# Patient Record
Sex: Female | Born: 1965
Health system: Southern US, Community
[De-identification: ages and names within clinical notes are randomized; demographics above are authoritative.]

## PROBLEM LIST (undated history)

## (undated) DIAGNOSIS — Z803 Family history of malignant neoplasm of breast: Secondary | ICD-10-CM

## (undated) DIAGNOSIS — E119 Type 2 diabetes mellitus without complications: Secondary | ICD-10-CM

## (undated) DIAGNOSIS — I10 Essential (primary) hypertension: Secondary | ICD-10-CM

## (undated) DIAGNOSIS — R51 Headache: Secondary | ICD-10-CM

## (undated) DIAGNOSIS — R519 Headache, unspecified: Secondary | ICD-10-CM

## (undated) DIAGNOSIS — Z8041 Family history of malignant neoplasm of ovary: Secondary | ICD-10-CM

## (undated) DIAGNOSIS — H539 Unspecified visual disturbance: Secondary | ICD-10-CM

## (undated) DIAGNOSIS — E049 Nontoxic goiter, unspecified: Secondary | ICD-10-CM

## (undated) DIAGNOSIS — D649 Anemia, unspecified: Secondary | ICD-10-CM

## (undated) DIAGNOSIS — R0602 Shortness of breath: Secondary | ICD-10-CM

## (undated) HISTORY — DX: Headache, unspecified: R51.9

## (undated) HISTORY — PX: CHOLECYSTECTOMY: SHX55

## (undated) HISTORY — DX: Headache: R51

## (undated) HISTORY — DX: Family history of malignant neoplasm of breast: Z80.3

## (undated) HISTORY — DX: Family history of malignant neoplasm of ovary: Z80.41

## (undated) HISTORY — DX: Type 2 diabetes mellitus without complications: E11.9

## (undated) HISTORY — PX: TUBAL LIGATION: SHX77

## (undated) HISTORY — DX: Unspecified visual disturbance: H53.9

## (undated) HISTORY — PX: BREAST REDUCTION SURGERY: SHX8

---

## 1997-06-23 ENCOUNTER — Inpatient Hospital Stay (HOSPITAL_COMMUNITY): Admission: AD | Admit: 1997-06-23 | Discharge: 1997-06-23 | Payer: Self-pay | Admitting: Obstetrics and Gynecology

## 1997-06-24 ENCOUNTER — Inpatient Hospital Stay (HOSPITAL_COMMUNITY): Admission: AD | Admit: 1997-06-24 | Discharge: 1997-06-24 | Payer: Self-pay | Admitting: Obstetrics and Gynecology

## 1997-06-27 ENCOUNTER — Encounter (HOSPITAL_COMMUNITY): Admission: RE | Admit: 1997-06-27 | Discharge: 1997-07-05 | Payer: Self-pay | Admitting: Obstetrics and Gynecology

## 1997-07-05 ENCOUNTER — Inpatient Hospital Stay (HOSPITAL_COMMUNITY): Admission: AD | Admit: 1997-07-05 | Discharge: 1997-07-08 | Payer: Self-pay | Admitting: Obstetrics and Gynecology

## 1999-02-09 ENCOUNTER — Other Ambulatory Visit: Admission: RE | Admit: 1999-02-09 | Discharge: 1999-02-09 | Payer: Self-pay | Admitting: Family Medicine

## 2001-03-07 HISTORY — PX: THYROID LOBECTOMY: SHX420

## 2001-03-16 ENCOUNTER — Ambulatory Visit (HOSPITAL_COMMUNITY): Admission: RE | Admit: 2001-03-16 | Discharge: 2001-03-17 | Payer: Self-pay | Admitting: General Surgery

## 2001-03-16 ENCOUNTER — Encounter (INDEPENDENT_AMBULATORY_CARE_PROVIDER_SITE_OTHER): Payer: Self-pay | Admitting: Specialist

## 2001-03-25 ENCOUNTER — Other Ambulatory Visit: Admission: RE | Admit: 2001-03-25 | Discharge: 2001-03-25 | Payer: Self-pay | Admitting: Family Medicine

## 2002-08-11 ENCOUNTER — Encounter: Admission: RE | Admit: 2002-08-11 | Discharge: 2002-08-11 | Payer: Self-pay | Admitting: Family Medicine

## 2002-08-11 ENCOUNTER — Encounter: Payer: Self-pay | Admitting: Family Medicine

## 2002-11-04 ENCOUNTER — Emergency Department (HOSPITAL_COMMUNITY): Admission: EM | Admit: 2002-11-04 | Discharge: 2002-11-04 | Payer: Self-pay | Admitting: Emergency Medicine

## 2002-11-04 ENCOUNTER — Encounter: Payer: Self-pay | Admitting: Emergency Medicine

## 2003-08-02 ENCOUNTER — Other Ambulatory Visit: Admission: RE | Admit: 2003-08-02 | Discharge: 2003-08-02 | Payer: Self-pay | Admitting: Family Medicine

## 2004-10-01 ENCOUNTER — Other Ambulatory Visit: Admission: RE | Admit: 2004-10-01 | Discharge: 2004-10-01 | Payer: Self-pay | Admitting: Family Medicine

## 2004-10-02 ENCOUNTER — Encounter: Admission: RE | Admit: 2004-10-02 | Discharge: 2004-10-02 | Payer: Self-pay | Admitting: Family Medicine

## 2004-10-30 ENCOUNTER — Encounter: Admission: RE | Admit: 2004-10-30 | Discharge: 2004-10-30 | Payer: Self-pay | Admitting: Family Medicine

## 2005-02-04 HISTORY — PX: REDUCTION MAMMAPLASTY: SUR839

## 2005-02-13 ENCOUNTER — Emergency Department (HOSPITAL_COMMUNITY): Admission: EM | Admit: 2005-02-13 | Discharge: 2005-02-13 | Payer: Self-pay | Admitting: Family Medicine

## 2005-09-04 HISTORY — PX: OTHER SURGICAL HISTORY: SHX169

## 2005-09-16 ENCOUNTER — Ambulatory Visit (HOSPITAL_BASED_OUTPATIENT_CLINIC_OR_DEPARTMENT_OTHER): Admission: RE | Admit: 2005-09-16 | Discharge: 2005-09-17 | Payer: Self-pay | Admitting: Plastic Surgery

## 2005-09-16 ENCOUNTER — Encounter (INDEPENDENT_AMBULATORY_CARE_PROVIDER_SITE_OTHER): Payer: Self-pay | Admitting: *Deleted

## 2005-11-09 ENCOUNTER — Emergency Department (HOSPITAL_COMMUNITY): Admission: EM | Admit: 2005-11-09 | Discharge: 2005-11-09 | Payer: Self-pay | Admitting: Emergency Medicine

## 2006-06-04 ENCOUNTER — Encounter: Admission: RE | Admit: 2006-06-04 | Discharge: 2006-06-04 | Payer: Self-pay | Admitting: Family Medicine

## 2006-07-04 ENCOUNTER — Other Ambulatory Visit: Admission: RE | Admit: 2006-07-04 | Discharge: 2006-07-04 | Payer: Self-pay | Admitting: Family Medicine

## 2006-12-04 ENCOUNTER — Encounter: Admission: RE | Admit: 2006-12-04 | Discharge: 2006-12-04 | Payer: Self-pay | Admitting: Family Medicine

## 2007-06-12 ENCOUNTER — Encounter: Admission: RE | Admit: 2007-06-12 | Discharge: 2007-06-12 | Payer: Self-pay | Admitting: Family Medicine

## 2007-07-29 ENCOUNTER — Other Ambulatory Visit: Admission: RE | Admit: 2007-07-29 | Discharge: 2007-07-29 | Payer: Self-pay | Admitting: Family Medicine

## 2008-06-13 ENCOUNTER — Encounter: Admission: RE | Admit: 2008-06-13 | Discharge: 2008-06-13 | Payer: Self-pay | Admitting: Family Medicine

## 2008-06-15 ENCOUNTER — Encounter: Admission: RE | Admit: 2008-06-15 | Discharge: 2008-06-15 | Payer: Self-pay | Admitting: Family Medicine

## 2008-11-14 ENCOUNTER — Emergency Department (HOSPITAL_COMMUNITY): Admission: EM | Admit: 2008-11-14 | Discharge: 2008-11-15 | Payer: Self-pay | Admitting: Emergency Medicine

## 2008-12-28 ENCOUNTER — Other Ambulatory Visit: Admission: RE | Admit: 2008-12-28 | Discharge: 2008-12-28 | Payer: Self-pay | Admitting: Family Medicine

## 2009-03-25 ENCOUNTER — Ambulatory Visit: Payer: Self-pay | Admitting: Diagnostic Radiology

## 2009-03-25 ENCOUNTER — Emergency Department (HOSPITAL_BASED_OUTPATIENT_CLINIC_OR_DEPARTMENT_OTHER): Admission: EM | Admit: 2009-03-25 | Discharge: 2009-03-26 | Payer: Self-pay | Admitting: Emergency Medicine

## 2009-11-03 ENCOUNTER — Encounter: Admission: RE | Admit: 2009-11-03 | Discharge: 2009-11-03 | Payer: Self-pay | Admitting: Diagnostic Neuroimaging

## 2010-01-01 ENCOUNTER — Other Ambulatory Visit: Admission: RE | Admit: 2010-01-01 | Discharge: 2010-01-01 | Payer: Self-pay | Admitting: Family Medicine

## 2010-01-15 ENCOUNTER — Encounter
Admission: RE | Admit: 2010-01-15 | Discharge: 2010-01-15 | Payer: Self-pay | Source: Home / Self Care | Attending: Family Medicine | Admitting: Family Medicine

## 2010-01-30 ENCOUNTER — Encounter
Admission: RE | Admit: 2010-01-30 | Discharge: 2010-01-30 | Payer: Self-pay | Source: Home / Self Care | Attending: Family Medicine | Admitting: Family Medicine

## 2010-02-25 ENCOUNTER — Encounter: Payer: Self-pay | Admitting: Family Medicine

## 2010-04-27 LAB — BASIC METABOLIC PANEL
BUN: 13 mg/dL (ref 6–23)
CO2: 32 mEq/L (ref 19–32)
Calcium: 9.2 mg/dL (ref 8.4–10.5)
Chloride: 102 mEq/L (ref 96–112)
Creatinine, Ser: 0.9 mg/dL (ref 0.4–1.2)
GFR calc Af Amer: >60 mL/min (ref >60)
GFR calc non Af Amer: >60 mL/min (ref >60)
Glucose, Bld: 105 mg/dL — ABNORMAL HIGH (ref 70–99)
Potassium: 2.9 mEq/L — ABNORMAL LOW (ref 3.5–5.1)
Sodium: 144 mEq/L (ref 135–145)

## 2010-04-27 LAB — PROTEIN, CSF: Total  Protein, CSF: 21 mg/dL (ref 15–45)

## 2010-04-27 LAB — CBC
HCT: 28.9 % — ABNORMAL LOW (ref 36.0–46.0)
Hemoglobin: 9.1 g/dL — ABNORMAL LOW (ref 12.0–15.0)
MCHC: 31.4 g/dL (ref 30.0–36.0)
MCV: 67 fL — ABNORMAL LOW (ref 78.0–100.0)
Platelets: 294 10*3/uL (ref 150–400)
RBC: 4.31 MIL/uL (ref 3.87–5.11)
RDW: 17.3 % — ABNORMAL HIGH (ref 11.5–15.5)
WBC: 8.1 10*3/uL (ref 4.0–10.5)

## 2010-04-27 LAB — DIFFERENTIAL
Basophils Absolute: 0.1 10*3/uL (ref 0.0–0.1)
Basophils Relative: 1 % (ref 0–1)
Eosinophils Absolute: 0.2 10*3/uL (ref 0.0–0.7)
Eosinophils Relative: 2 % (ref 0–5)
Lymphocytes Relative: 25 % (ref 12–46)
Lymphs Abs: 2 10*3/uL (ref 0.7–4.0)
Monocytes Absolute: 0.6 10*3/uL (ref 0.1–1.0)
Monocytes Relative: 7 % (ref 3–12)
Neutro Abs: 5.2 10*3/uL (ref 1.7–7.7)
Neutrophils Relative %: 65 % (ref 43–77)

## 2010-04-27 LAB — GRAM STAIN: Gram Stain: NONE SEEN

## 2010-04-27 LAB — CSF CELL COUNT WITH DIFFERENTIAL
RBC Count, CSF: 1 /mm3 — ABNORMAL HIGH
RBC Count, CSF: 7 /mm3 — ABNORMAL HIGH
Tube #: 1
Tube #: 4
WBC, CSF: 2 /mm3 (ref 0–5)
WBC, CSF: 2 /mm3 (ref 0–5)

## 2010-04-27 LAB — CSF CULTURE W GRAM STAIN
Culture: NO GROWTH
Gram Stain: NONE SEEN

## 2010-04-27 LAB — GLUCOSE, CSF: Glucose, CSF: 58 mg/dL (ref 43–76)

## 2010-05-11 LAB — URINALYSIS, ROUTINE W REFLEX MICROSCOPIC
Bilirubin Urine: NEGATIVE
Glucose, UA: NEGATIVE mg/dL
Hgb urine dipstick: NEGATIVE
Ketones, ur: NEGATIVE mg/dL
Nitrite: NEGATIVE
Protein, ur: NEGATIVE mg/dL
Specific Gravity, Urine: 1.009 (ref 1.005–1.030)
Urobilinogen, UA: 0.2 mg/dL (ref 0.0–1.0)
pH: 6 (ref 5.0–8.0)

## 2010-05-11 LAB — BASIC METABOLIC PANEL
BUN: 8 mg/dL (ref 6–23)
CO2: 28 mEq/L (ref 19–32)
Calcium: 8.8 mg/dL (ref 8.4–10.5)
Chloride: 104 mEq/L (ref 96–112)
Creatinine, Ser: 0.79 mg/dL (ref 0.4–1.2)
GFR calc Af Amer: >60 mL/min (ref >60)
GFR calc non Af Amer: >60 mL/min (ref >60)
Glucose, Bld: 123 mg/dL — ABNORMAL HIGH (ref 70–99)
Potassium: 3.3 mEq/L — ABNORMAL LOW (ref 3.5–5.1)
Sodium: 138 mEq/L (ref 135–145)

## 2010-05-11 LAB — POCT CARDIAC MARKERS
CKMB, poc: <1 ng/mL — ABNORMAL LOW (ref 1.0–8.0)
Myoglobin, poc: 93.5 ng/mL (ref 12–200)
Troponin i, poc: <0.05 ng/mL (ref 0.00–0.09)

## 2010-05-11 LAB — CBC
HCT: 27.9 % — ABNORMAL LOW (ref 36.0–46.0)
Hemoglobin: 8.6 g/dL — ABNORMAL LOW (ref 12.0–15.0)
MCHC: 31 g/dL (ref 30.0–36.0)
MCV: 66.5 fL — ABNORMAL LOW (ref 78.0–100.0)
Platelets: 303 10*3/uL (ref 150–400)
RBC: 4.19 MIL/uL (ref 3.87–5.11)
RDW: 18.8 % — ABNORMAL HIGH (ref 11.5–15.5)
WBC: 9.9 10*3/uL (ref 4.0–10.5)

## 2010-05-11 LAB — DIFFERENTIAL
Basophils Absolute: 0 10*3/uL (ref 0.0–0.1)
Basophils Relative: 0 % (ref 0–1)
Eosinophils Absolute: 0.1 10*3/uL (ref 0.0–0.7)
Eosinophils Relative: 1 % (ref 0–5)
Lymphocytes Relative: 19 % (ref 12–46)
Lymphs Abs: 1.9 10*3/uL (ref 0.7–4.0)
Monocytes Absolute: 0.7 10*3/uL (ref 0.1–1.0)
Monocytes Relative: 7 % (ref 3–12)
Neutro Abs: 7.2 10*3/uL (ref 1.7–7.7)
Neutrophils Relative %: 73 % (ref 43–77)

## 2010-05-11 LAB — HEMOCCULT GUIAC POC 1CARD (OFFICE): Fecal Occult Bld: NEGATIVE

## 2010-05-11 LAB — D-DIMER, QUANTITATIVE: D-Dimer, Quant: 0.43 ug/mL-FEU (ref 0.00–0.48)

## 2010-06-22 NOTE — Op Note (Signed)
Crystal Mercado, Crystal Mercado               ACCOUNT NO.:  [id number]   MEDICAL RECORD NO.:  [id number]          PATIENT TYPE:  AMB   LOCATION:  DSC                          FACILITY:  [facility]   PHYSICIAN:  Brantley Persons, M.D.DATE OF BIRTH:  10/22/1965   DATE OF PROCEDURE:  09/17/2005  DATE OF DISCHARGE:  09/17/2005                                 OPERATIVE REPORT   PREOPERATIVE DIAGNOSIS:  Bilateral macromastia.   POSTOPERATIVE DIAGNOSIS:  Bilateral macromastia.   PROCEDURE:  Bilateral reduction mammoplasties.   ATTENDING SURGEON:  Brantley Persons, M.D.   ANESTHESIA:  General endotracheal.   ANESTHESIOLOGIST:  Dr. Gelene Mink.   ESTIMATED BLOOD LOSS:  150 mL.   FLUID REPLACEMENT:  3200 mL crystalloid.   URINE OUTPUT:  800 mL.   COMPLICATIONS:  None.   INDICATIONS FOR PROCEDURE:  The patient is a 45 year old African American  female with bilateral macromastia that is clinically symptomatic.  She  presents to undergo bilateral reduction mammoplasties.   JUSTIFICATION FOR OVERNIGHT STAY:  Progressive pain control along with  ambulation and monitoring of the nipples and breast flaps.   PROCEDURE:  The patient was marked in preop holding area in the pattern of  wise for the future bilateral reduction mammoplasties.  She was then taken  back to the OR, placed on the table in supine position.  After adequate  general endotracheal anesthesia was obtained, the patient's chest was  prepped with Betadine and alcohol and draped in sterile fashion.  The base  of the breasts were then injected 1% lidocaine with epinephrine.  After  adequate hemostasis anesthesia had taken effect, the procedure was begun.   Both breast reductions were performed in the breasts in the following  manner.  Using the 45 mm nipple marker, the nipples were marked.  Then the  skin incised and de-epithelialized around the nipple-areolar complex down to  the inframammary crease in the inferior pedicle pattern.  Next  the medial,  superior and lateral skin flaps were elevated down to the chest wall.  The  excess fat and glandular tissue were then removed from the inferior pedicle.  The nipple-areolar complex was then examined and found to be pink and  viable.  The wound was irrigated with saline irrigation.  Meticulous  hemostasis was obtained with the Bovie electrocautery.  The inferior  pedicles were centralized using a 3-0 Prolene suture.  A #10 JP flat fully  fluted drain was then placed into the wound.  The skin flaps brought  together at the inverted T junction with a 2-0 Prolene suture.  The  incisions were stapled for temporary closure.  The breasts were then  compared and found to have good shape and symmetry.  The staples were then  removed and the incisions were closed using a 3-0 Monocryl in the dermal  layer followed by 4-0 Monocryl running intracuticular stitch on the skin.  The JP drains were sewn in place using 3-0 nylon suture.   The patient was then placed in the upright position.  The future location of  the nipple-areolar complexes was marked on each breast mound using  the 42 mm  nipple marker.  The patient was then placed back into the recumbent  position.   The nipple-areolar complexes were then brought out onto the breast mounds on  both breasts in the following manner.  Using the previously marked the  nipple-areolar complex locations on the breast mounds.  This was incised and  the skin and soft tissue was excised full thickness.  The nipple-areolar  complex was then brought out through this aperture and sewn in place using 4-  0 Monocryl in the dermal layer followed by a 5-0 Monocryl running  intracuticular stitch on the skin.  The incisions were then dressed with  Benzoin and Steri-Strips and the nipples additionally with bacitracin  ointment and Adaptic.  4x4s were then placed over the incisions and ABD  pads.  The patient was then placed into a light postoperative support  bra  for a dressing.  There no complications.  The patient tolerated procedure  well.  The final needle and sponge counts reported to be correct the end of  the case.  The patient was then awakened from general anesthesia and taken  to recovery room in stable condition.  She will remain overnight in the [facility]  for progressive pain control along with ambulation and monitoring of the  nipples and breast flaps.  Discharge planned for the morning.           ______________________________  Brantley Persons, M.D.     MC/MEDQ  D:  09/17/2005  T:  09/17/2005  Job:  [id number]

## 2010-06-22 NOTE — Op Note (Signed)
[facility]  Patient:    Crystal Mercado, Crystal Mercado Visit Number: [id number] MRN: [id number]          Service Type: DSU Location: [phone] Attending Physician:  Tempie Donning Dictated by:   Gita Kudo, M.D. Proc. Date: 03/16/01 Admit Date:  03/16/2001   CC:         Melvyn Neth D. Clovis Riley, M.D., [facility]  Delano Metz, M.D., [facility], [address] [place], [place]  Veverly Fells. Arletha Grippe, M.D.   Operative Report  OPERATION/PROCEDURE: Left thyroid lobectomy.  SURGEON: Gita Kudo, M.D.  ASSISTANT: Ollen Gross. Vernell Morgans, M.D.  PREOPERATIVE DIAGNOSIS: Large left thyroid nodule, substernal goiter.  POSTOPERATIVE DIAGNOSIS: Large left thyroid nodule, substernal goiter.  INDICATIONS FOR PROCEDURE: This 45 year old female presented with URI and chest x-ray showed a left neck mass extending into the superior mediastinum. CT scan of chest and neck confirmed a large thyroid nodule - substernal.  Scan also showed that the gland was normal on the right side.  Her thyroid function studies were within normal limits.  OPERATIVE FINDINGS: The patient had a very large left thyroid nodule.  The more superior portion of the left lobe looked and felt normal.  The entire right lobe felt normal.  Careful inspection showed that the parathyroid glands remained.  There is a question of an injury to the recurrent nerve on the left.  At the end of the procedure, careful examination by anesthesia showed both vocal cords to be moving well.  DESCRIPTION OF PROCEDURE: Under satisfactory general endotracheal anesthesia, the patients neck was prepped and draped in standard fashion and she was positioned appropriately.  A transverse incision was made and skin flaps developed, and a self-retaining retractor placed.  The midline was opened and the strap muscles were retracted and cut.  Staying close to the gland, we realized that it was  lying quite low in the neck.  The superior pole was divided between clamps and ties of 2-0 silk and metal clips.  Then, working from lateral to medial, the gland was retracted medially and the capsule incised.  The inferior thyroid vessels were divided between clamps and ties of silk and then finger dissection used to develop the plane around the thyroid gland into the superior midsternum, and the gland was delivered into the wound.  The inferior attachments and vein were divided between clamps and ties of silk and then carefully the gland was rotated from lateral to medially.  I could identify the esophagus with a large catheter in it.  The superior pole was then retracted medially and the gland divided at the isthmus with clamps and suture ligatures of silk.  The gland was then removed from the trachea and sent for pathology.  The wound was then carefully inspected.  I saw what appeared to be a large nerve that appeared to be divided inferiorly.  I was concerned that it was a recurrent nerve.  I followed it superiorly and it was not divided superiorly and looked as though it entered the larynx.  Because of concern I discussed the case with Dr. Arletha Grippe.  When sure of hemostasis, I did place a single 5-0 nylon suture through what I thought was the more proximal cut end and the distal area where there was a tie for possible later use if necessary and identification purposes.  Small pieces of Surgicel were then placed over the bed and the wound lavaged with saline, suctioned dry, and closed  in layers with running 3-0 Vicryl for midline, 4-0 Vicryl for platysma, Steri-Strips and staples for skin.  A sterile absorbent dressing was applied.  Then, the patient was inspected as she was extubated by Dr. Michelle Piper, the anesthesiologist.  She was kept anesthetized but not paralyzed so that we could visualize her cords quite well, and she was breathing on her own.  We were able to identify both cords  moving quite well and approximating and then opening.  She then went to the recovery room from the operating room in good condition. Dictated by:   Gita Kudo, M.D. Attending Physician:  Tempie Donning DD:  03/16/01 TD:  03/16/01 Job: [id number]

## 2010-12-10 ENCOUNTER — Other Ambulatory Visit: Payer: Self-pay | Admitting: Obstetrics and Gynecology

## 2011-01-03 ENCOUNTER — Encounter (HOSPITAL_COMMUNITY): Payer: Self-pay | Admitting: Pharmacist

## 2011-01-05 ENCOUNTER — Other Ambulatory Visit: Payer: Self-pay | Admitting: Obstetrics and Gynecology

## 2011-01-09 ENCOUNTER — Encounter (HOSPITAL_COMMUNITY)
Admission: RE | Admit: 2011-01-09 | Discharge: 2011-01-09 | Disposition: A | Discharge status: Home / Self Care | Payer: BC Managed Care – PPO | Source: Ambulatory Visit | Attending: Obstetrics and Gynecology | Admitting: Obstetrics and Gynecology

## 2011-01-09 ENCOUNTER — Other Ambulatory Visit: Payer: Self-pay

## 2011-01-09 ENCOUNTER — Encounter (HOSPITAL_COMMUNITY): Payer: Self-pay

## 2011-01-09 HISTORY — DX: Anemia, unspecified: D64.9

## 2011-01-09 HISTORY — DX: Essential (primary) hypertension: I10

## 2011-01-09 HISTORY — DX: Shortness of breath: R06.02

## 2011-01-09 LAB — CBC
HCT: 31.4 % — ABNORMAL LOW (ref 36.0–46.0)
Hemoglobin: 8.8 g/dL — ABNORMAL LOW (ref 12.0–15.0)
MCH: 19.6 pg — ABNORMAL LOW (ref 26.0–34.0)
MCHC: 28 g/dL — ABNORMAL LOW (ref 30.0–36.0)
MCV: 69.9 fL — ABNORMAL LOW (ref 78.0–100.0)
Platelets: 311 10*3/uL (ref 150–400)
RBC: 4.49 MIL/uL (ref 3.87–5.11)
RDW: 22.5 % — ABNORMAL HIGH (ref 11.5–15.5)
WBC: 9.3 10*3/uL (ref 4.0–10.5)

## 2011-01-09 LAB — BASIC METABOLIC PANEL
BUN: 10 mg/dL (ref 6–23)
CO2: 26 mEq/L (ref 19–32)
Calcium: 10 mg/dL (ref 8.4–10.5)
Chloride: 103 mEq/L (ref 96–112)
Creatinine, Ser: 0.84 mg/dL (ref 0.50–1.10)
GFR calc Af Amer: >90 mL/min (ref >90)
GFR calc non Af Amer: 83 mL/min — ABNORMAL LOW (ref >90)
Glucose, Bld: 108 mg/dL — ABNORMAL HIGH (ref 70–99)
Potassium: 3.4 mEq/L — ABNORMAL LOW (ref 3.5–5.1)
Sodium: 141 mEq/L (ref 135–145)

## 2011-01-09 LAB — DIFFERENTIAL
Basophils Absolute: 0 10*3/uL (ref 0.0–0.1)
Basophils Relative: 0 % (ref 0–1)
Eosinophils Absolute: 0.3 10*3/uL (ref 0.0–0.7)
Eosinophils Relative: 3 % (ref 0–5)
Lymphocytes Relative: 20 % (ref 12–46)
Lymphs Abs: 1.8 10*3/uL (ref 0.7–4.0)
Monocytes Absolute: 0.6 10*3/uL (ref 0.1–1.0)
Monocytes Relative: 7 % (ref 3–12)
Neutro Abs: 6.5 10*3/uL (ref 1.7–7.7)
Neutrophils Relative %: 71 % (ref 43–77)

## 2011-01-09 NOTE — Pre-Procedure Instructions (Signed)
Dr Arby Barrette reviewed patient's history, signed off on her EKG of today and was made aware of her bp reading at pre-op visit today. 160/103 large cuff, left arm; 159/104 large cuff left arm rechecked, 155/106 large cuff, right arm.  BP ok with Dr Arby Barrette.  Patient informed to take BP meds on DOS prior to 10am (on clears til 10am for 2pm surgery)

## 2011-01-09 NOTE — Patient Instructions (Signed)
   Your procedure is scheduled on: Wed, Dec12th  Enter through the [employer] of [facility] at: 12:30pm Pick up the phone at the desk and dial [phone] and inform us of your arrival.  Please call this number if you have any problems the morning of surgery: [phone]  Remember: Do not eat food after midnight: Tuesday Do not drink clear liquids after: 10:00am Wednesday Take these medicines the morning of surgery with a SIP OF WATER: prior to 10 am  Take HCTZ and verapamil blood pressure meds  Do not wear jewelry, make-up, or FINGER nail polish Do not wear lotions, powders, perfumes or deodorant. Do not shave 48 hours prior to surgery. Do not bring valuables to the hospital.  Patients discharged on the day of surgery will not be allowed to drive home.  Home with daughter Crystal Mercado cell [phone]    Remember to use your hibiclens as instructed.Please shower with 1/2 bottle the evening before your surgery and the other 1/2 bottle the morning of surgery.

## 2011-01-10 ENCOUNTER — Other Ambulatory Visit (HOSPITAL_COMMUNITY): Payer: BC Managed Care – PPO

## 2011-01-15 MED ORDER — CEFAZOLIN SODIUM-DEXTROSE 2-3 GM-% IV SOLR
2.0000 g | INTRAVENOUS | Status: AC
  Administered 2011-01-16: 2 g via INTRAVENOUS
  Filled 2011-01-15: qty 50

## 2011-01-16 ENCOUNTER — Ambulatory Visit (HOSPITAL_COMMUNITY): Payer: BC Managed Care – PPO | Admitting: Anesthesiology

## 2011-01-16 ENCOUNTER — Ambulatory Visit (HOSPITAL_COMMUNITY)
Admission: RE | Admit: 2011-01-16 | Discharge: 2011-01-16 | Disposition: A | Discharge status: Home / Self Care | Payer: BC Managed Care – PPO | Source: Ambulatory Visit | Attending: Obstetrics and Gynecology | Admitting: Obstetrics and Gynecology

## 2011-01-16 ENCOUNTER — Encounter (HOSPITAL_COMMUNITY): Payer: Self-pay | Admitting: Anesthesiology

## 2011-01-16 ENCOUNTER — Encounter (HOSPITAL_COMMUNITY): Payer: Self-pay | Admitting: *Deleted

## 2011-01-16 ENCOUNTER — Encounter (HOSPITAL_COMMUNITY)
Admission: RE | Disposition: A | Discharge status: Home / Self Care | Payer: Self-pay | Source: Ambulatory Visit | Attending: Obstetrics and Gynecology

## 2011-01-16 DIAGNOSIS — N949 Unspecified condition associated with female genital organs and menstrual cycle: Secondary | ICD-10-CM | POA: Insufficient documentation

## 2011-01-16 DIAGNOSIS — D649 Anemia, unspecified: Secondary | ICD-10-CM | POA: Insufficient documentation

## 2011-01-16 DIAGNOSIS — N925 Other specified irregular menstruation: Secondary | ICD-10-CM | POA: Insufficient documentation

## 2011-01-16 DIAGNOSIS — N938 Other specified abnormal uterine and vaginal bleeding: Secondary | ICD-10-CM | POA: Insufficient documentation

## 2011-01-16 LAB — TYPE AND SCREEN
ABO/RH(D): A POS
Antibody Screen: NEGATIVE

## 2011-01-16 LAB — HCG, SERUM, QUALITATIVE: Preg, Serum: NEGATIVE

## 2011-01-16 LAB — ABO/RH: ABO/RH(D): A POS

## 2011-01-16 SURGERY — DILATATION & CURETTAGE/HYSTEROSCOPY WITH HYDROTHERMAL ABLATION
Anesthesia: General

## 2011-01-16 MED ORDER — KETOROLAC TROMETHAMINE 60 MG/2ML IM SOLN
INTRAMUSCULAR | Status: AC
  Filled 2011-01-16: qty 2

## 2011-01-16 MED ORDER — FENTANYL CITRATE 0.05 MG/ML IJ SOLN
INTRAMUSCULAR | Status: AC
  Administered 2011-01-16: 50 ug via INTRAVENOUS
  Filled 2011-01-16: qty 2

## 2011-01-16 MED ORDER — KETOROLAC TROMETHAMINE 30 MG/ML IJ SOLN
INTRAMUSCULAR | Status: DC | PRN
  Administered 2011-01-16: 30 mg via INTRAVENOUS

## 2011-01-16 MED ORDER — ONDANSETRON HCL 4 MG/2ML IJ SOLN
INTRAMUSCULAR | Status: AC
  Filled 2011-01-16: qty 2

## 2011-01-16 MED ORDER — DEXAMETHASONE SODIUM PHOSPHATE 10 MG/ML IJ SOLN
INTRAMUSCULAR | Status: DC | PRN
  Administered 2011-01-16: 10 mg via INTRAVENOUS

## 2011-01-16 MED ORDER — OXYCODONE-ACETAMINOPHEN 5-325 MG PO TABS: ORAL_TABLET | ORAL | Status: DC

## 2011-01-16 MED ORDER — IBUPROFEN 600 MG PO TABS: 600.0000 mg | ORAL_TABLET | Freq: Four times a day (QID) | ORAL | Status: AC | PRN

## 2011-01-16 MED ORDER — LIDOCAINE HCL 1 % IJ SOLN
INTRAMUSCULAR | Status: DC | PRN
  Administered 2011-01-16: 7 mL

## 2011-01-16 MED ORDER — EPHEDRINE SULFATE 50 MG/ML IJ SOLN
INTRAMUSCULAR | Status: DC | PRN
  Administered 2011-01-16 (×3): 5 mg via INTRAVENOUS

## 2011-01-16 MED ORDER — FENTANYL CITRATE 0.05 MG/ML IJ SOLN
INTRAMUSCULAR | Status: DC | PRN
  Administered 2011-01-16: 100 ug via INTRAVENOUS

## 2011-01-16 MED ORDER — LACTATED RINGERS IV SOLN
INTRAVENOUS | Status: DC
  Administered 2011-01-16 (×3): via INTRAVENOUS

## 2011-01-16 MED ORDER — EPHEDRINE 5 MG/ML INJ
INTRAVENOUS | Status: AC
  Filled 2011-01-16: qty 10

## 2011-01-16 MED ORDER — FENTANYL CITRATE 0.05 MG/ML IJ SOLN
INTRAMUSCULAR | Status: AC
  Filled 2011-01-16: qty 2

## 2011-01-16 MED ORDER — LIDOCAINE HCL (CARDIAC) 20 MG/ML IV SOLN
INTRAVENOUS | Status: DC | PRN
  Administered 2011-01-16: 80 mg via INTRAVENOUS

## 2011-01-16 MED ORDER — ONDANSETRON HCL 4 MG/2ML IJ SOLN
INTRAMUSCULAR | Status: DC | PRN
  Administered 2011-01-16: 4 mg via INTRAVENOUS

## 2011-01-16 MED ORDER — METOCLOPRAMIDE HCL 5 MG/ML IJ SOLN
10.0000 mg | INTRAMUSCULAR | Status: AC
  Administered 2011-01-16: 10 mg via INTRAVENOUS

## 2011-01-16 MED ORDER — LIDOCAINE HCL (CARDIAC) 20 MG/ML IV SOLN
INTRAVENOUS | Status: AC
  Filled 2011-01-16: qty 5

## 2011-01-16 MED ORDER — PROPOFOL 10 MG/ML IV EMUL
INTRAVENOUS | Status: DC | PRN
  Administered 2011-01-16: 200 mg via INTRAVENOUS

## 2011-01-16 MED ORDER — METOCLOPRAMIDE HCL 5 MG/ML IJ SOLN: 5.0000 mg | INTRAMUSCULAR | Status: DC

## 2011-01-16 MED ORDER — OXYCODONE-ACETAMINOPHEN 5-325 MG PO TABS
ORAL_TABLET | ORAL | Status: AC
  Administered 2011-01-16: 1 via ORAL
  Filled 2011-01-16: qty 1

## 2011-01-16 MED ORDER — DEXAMETHASONE SODIUM PHOSPHATE 10 MG/ML IJ SOLN
INTRAMUSCULAR | Status: AC
  Filled 2011-01-16: qty 1

## 2011-01-16 MED ORDER — METOCLOPRAMIDE HCL 5 MG/ML IJ SOLN
INTRAMUSCULAR | Status: AC
  Administered 2011-01-16: 10 mg via INTRAVENOUS
  Filled 2011-01-16: qty 2

## 2011-01-16 MED ORDER — PROPOFOL 10 MG/ML IV EMUL
INTRAVENOUS | Status: AC
  Filled 2011-01-16: qty 20

## 2011-01-16 MED ORDER — MIDAZOLAM HCL 5 MG/5ML IJ SOLN
INTRAMUSCULAR | Status: DC | PRN
  Administered 2011-01-16: 2 mg via INTRAVENOUS

## 2011-01-16 MED ORDER — FENTANYL CITRATE 0.05 MG/ML IJ SOLN
25.0000 ug | INTRAMUSCULAR | Status: DC | PRN
  Administered 2011-01-16 (×2): 50 ug via INTRAVENOUS

## 2011-01-16 MED ORDER — OXYCODONE-ACETAMINOPHEN 5-325 MG PO TABS
1.0000 | ORAL_TABLET | Freq: Once | ORAL | Status: AC
  Administered 2011-01-16: 1 via ORAL

## 2011-01-16 MED ORDER — MIDAZOLAM HCL 2 MG/2ML IJ SOLN
INTRAMUSCULAR | Status: AC
  Filled 2011-01-16: qty 2

## 2011-01-16 MED ORDER — KETOROLAC TROMETHAMINE 30 MG/ML IJ SOLN
INTRAMUSCULAR | Status: AC
  Filled 2011-01-16: qty 1

## 2011-01-16 SURGICAL SUPPLY — 14 items
CANISTER SUCTION 2500CC (MISCELLANEOUS) ×2 IMPLANT
CATH ROBINSON RED A/P 16FR (CATHETERS) ×2 IMPLANT
CLOTH BEACON ORANGE TIMEOUT ST (SAFETY) ×2 IMPLANT
CONTAINER PREFILL 10% NBF 60ML (FORM) ×4 IMPLANT
DILATOR CANAL MILEX (MISCELLANEOUS) IMPLANT
GLOVE BIO SURGEON STRL SZ7 (GLOVE) ×2 IMPLANT
GLOVE BIOGEL PI IND STRL 7.0 (GLOVE) ×2 IMPLANT
GLOVE BIOGEL PI INDICATOR 7.0 (GLOVE) ×2
GOWN PREVENTION PLUS LG XLONG (DISPOSABLE) ×2 IMPLANT
GOWN STRL REIN XL XLG (GOWN DISPOSABLE) ×2 IMPLANT
PACK HYSTEROSCOPY LF (CUSTOM PROCEDURE TRAY) ×2 IMPLANT
SET GENESYS HTA PROCERVA (MISCELLANEOUS) ×2 IMPLANT
TOWEL OR 17X24 6PK STRL BLUE (TOWEL DISPOSABLE) ×4 IMPLANT
WATER STERILE IRR 1000ML POUR (IV SOLUTION) ×2 IMPLANT

## 2011-01-16 NOTE — Anesthesia Preprocedure Evaluation (Addendum)
Anesthesia Evaluation  Patient identified by MRN, date of birth, ID band Patient awake    Reviewed: Allergy & Precautions, H&P , Patient's Chart, lab work & pertinent test results, reviewed documented beta blocker date and time   Airway Mallampati: III TM Distance: >3 FB Neck ROM: full    Dental No notable dental hx.    Pulmonary  clear to auscultation  Pulmonary exam normal       Cardiovascular Pt. on medications regular Normal    Neuro/Psych    GI/Hepatic   Endo/Other  Morbid obesity  Renal/GU      Musculoskeletal   Abdominal   Peds  Hematology  (+) Blood dyscrasia, anemia ,   Anesthesia Other Findings   Reproductive/Obstetrics                         Anesthesia Physical Anesthesia Plan  ASA: III  Anesthesia Plan: General   Post-op Pain Management:    Induction: Intravenous  Airway Management Planned: LMA  Additional Equipment:   Intra-op Plan:   Post-operative Plan:   Informed Consent: I have reviewed the patients History and Physical, chart, labs and discussed the procedure including the risks, benefits and alternatives for the proposed anesthesia with the patient or authorized representative who has indicated his/her understanding and acceptance.   Dental Advisory Given  Plan Discussed with: CRNA and Surgeon  Anesthesia Plan Comments: (  Discussed  general anesthesia, including possible nausea, instrumentation of airway, sore throat,pulmonary aspiration, etc. I asked if the were any outstanding questions, or  concerns before we proceeded. )       Anesthesia Quick Evaluation

## 2011-01-16 NOTE — Transfer of Care (Signed)
Immediate Anesthesia Transfer of Care Note  Patient: Crystal Mercado  Procedure(s) Performed:  DILATATION & CURETTAGE/HYSTEROSCOPY WITH HYDROTHERMAL ABLATION  Patient Location: PACU  Anesthesia Type: General  Level of Consciousness: oriented and sedated  Airway & Oxygen Therapy: Patient Spontanous Breathing and Patient connected to nasal cannula oxygen  Post-op Assessment: Report given to PACU RN and Post -op Vital signs reviewed and stable  Post vital signs: stable  Complications: No apparent anesthesia complications

## 2011-01-16 NOTE — Interval H&P Note (Signed)
History and Physical Interval Note:  01/16/2011 2:39 PM  Crystal Mercado  has presented today for surgery, with the diagnosis of Abnormal Uterine Bleeding  The various methods of treatment have been discussed with the patient and family. After consideration of risks, benefits and other options for treatment, the patient has consented to  Procedure(s): DILATATION & CURETTAGE/HYSTEROSCOPY WITH HYDROTHERMAL ABLATION as a surgical intervention .  The patients' history has been reviewed, patient examined, no change in status, stable for surgery.  I have reviewed the patients' chart and labs.  Questions were answered to the patient's satisfaction.    Pt reports heavy menses this am but it has now slowed.  Dion Body, Rudi Knippenberg

## 2011-01-16 NOTE — Brief Op Note (Signed)
01/16/2011  3:54 PM  PATIENT:  Crystal Mercado  45 y.o. female  PRE-OPERATIVE DIAGNOSIS:  Abnormal Uterine Bleeding, uterine fibroids  POST-OPERATIVE DIAGNOSIS:  Abnormal Uterine Bleeding, same  PROCEDURE:  Procedure(s): HYSTEROSCOPY WITH HYDROTHERMAL ABLATION  SURGEON:  Surgeon(s): Geryl Rankins, MD  PHYSICIAN ASSISTANT: None  ASSISTANTS: Technician   ANESTHESIA:   LMA  EBL:  Total I/O In: 1300 [I.V.:1300] Out: 5 [Blood:5]  BLOOD ADMINISTERED:none  DRAINS: none   LOCAL MEDICATIONS USED:  1% LIDOCAINE 7 CC  SPECIMEN:  No Specimen  DISPOSITION OF SPECIMEN:  n/a  COUNTS:  YES  TOURNIQUET:  * No tourniquets in log *  DICTATION: .Other Dictation: Dictation Number [id number]  PLAN OF CARE: Discharge to home after PACU  PATIENT DISPOSITION:  PACU - hemodynamically stable.   Delay start of Pharmacological VTE agent (>24hrs) due to surgical blood loss or risk of bleeding:  {YES/NO/NOT APPLICABLE:20182

## 2011-01-16 NOTE — Anesthesia Postprocedure Evaluation (Signed)
  Anesthesia Post-op Note  Patient: Crystal Mercado  Procedure(s) Performed:  DILATATION & CURETTAGE/HYSTEROSCOPY WITH HYDROTHERMAL ABLATION  Patient Location: PACU  Anesthesia Type: General  Level of Consciousness: awake, alert  and oriented  Airway and Oxygen Therapy: Patient Spontanous Breathing  Post-op Pain: none  Post-op Assessment: Post-op Vital signs reviewed, Patient's Cardiovascular Status Stable, Respiratory Function Stable, Patent Airway, No signs of Nausea or vomiting, Adequate PO intake and Pain level controlled  Post-op Vital Signs: Reviewed and stable  Complications: No apparent anesthesia complications

## 2011-01-16 NOTE — H&P (Signed)
History of Present Illness  01/10/11  General:  45 y/o G4P3A1 presents for pre op exam for endometrial ablation on 01/16/11 via HTA ablation. Pt with significant menometrorrhagia with severe anemia. Hemoglobin has been as low as 7.9. She states bleeding slowed and stopped intermittently and resumed with Provera. She reports minimal bleeding with occasionally flooding. Pt states she is taking her iron regularly.   Current Medications  Klor-Con M20 20 MEQ Tablet Extended Release 1 tablet as needed  Iron 325 (65 Fe) MG Tablet 1 tablet Three times a day  Celebrex 200 MG Capsule 1 capsule Once a day as needed for pain  Provera 10 MG Tablet 1 tablet Once a day, stop date 01/15/2011  Verapamil HCl 240 MG Capsule Extended Release 24 Hour 1 tablet Once a day  Spironolactone-HCTZ 25-25 MG Tablet 1 tablet Once a day  Medication List reviewed and reconciled with the patient   Past Medical History  HTN  Anemia  Pseudotunor cerebri-neurologist   Surgical History  cholecystectomy   C Section x 3   bilateral tubal ligation   L Thyroid Lobectomy 2003  Breast Reduction 2007   Family History  Father: MI-60, hypertensiion   Mother: deceased breast cancer, hypertensiion   Brother 1: hypertensiion   Sister 1: alive breast cancer   Sister 2: hypertensiion    Social History  General:  History of smoking  cigarettes: Never smoked no Smoking.  no Alcohol.  Caffeine: very little.  no Recreational drug use.  no Diet.  Exercise: walk or aerobics 3 times per week.  Occupation: child care.  Marital Status: married.  Children: Boys, 1, girls, 2.  Seat belt use: yes.    Gyn History  Periods : irregular.  LMP 08/02/10-present.  Birth control BTL.  Last pap smear date 12/2009, WNL.  Abnormal pap smear treated with cryo in early 20's.  STD Chlamydia as a teenager.    OB History  Number of pregnancies 4.  miscarriages 1.  Pregnancy # 1 live birth, C-section delivery.  Pregnancy # 2 miscarriage.    Pregnancy # 3 live birth, C-section.  Pregnancy # 4: live birth, C-section.    Allergies  Norvasc: fatigue   Hospitalization/Major Diagnostic Procedure  c-section x 3   breast reduction   thyroid surgery   gallbladder removed    Vital Signs  Wt 226, Ht 63, BMI 40.03, Pulse sitting 71, BP sitting 157/64.   Physical Examination  GENERAL:  Patient appears in NAD, pleasant.  Build: well developed.  General Appearance: well-appearing, overweight.  Race: .  NECK:  ROM: normal.  Thyroid: no thyromegaly, non tender.  LUNGS:  Breath sounds: clear to auscultation.  Dyspnea: no.  HEART:  Murmurs: none.  Rate: normal.  Rhythm: regular.  ABDOMEN:  General: no masses,tenderness,organomegaly, , BS normal, non distended.  FEMALE GENITOURINARY:  Adnexa: no mass, non tender.  Anus/perineum: normal, no lesions.  Cervix/ cuff: normal appearance , no lesions/discharge/bleeding, good pelvic support .  External genitalia: normal, no lesions, no skin discoloration.  Rectum: deferred.  Urethra: normal external meatus.  Uterus: irregularly enlarged, non tender, freely mobile.  Vagina: pink/moist mucosa, no lesions, no abnormal discharge, odorless.  Vulva: normal, no lesions, no skin discoloration.  EXTREMITIES:  Extremities no clubbing cyanosis or edema present.  NEUROLOGICAL:  gross motor and sensory grossly intact.  Orientation: alert and oriented x 3.     Assessments   1. Pre-op exam - V72.84 (Primary)   2. DUB (Dysfunctional Uterine Bleeding) - 626.8  3. Anemia, unspecified - 285.9   Treatment  1. Pre-op exam  Fibroid uterus with severe anemia. Recoommend ablation until available to have a more extended recovery.

## 2011-01-16 NOTE — Op Note (Signed)
Crystal Mercado, Crystal Mercado               ACCOUNT NO.:  [id number]  MEDICAL RECORD NO.:  [id number]  LOCATION:  [facility]                          FACILITY:  [facility]  PHYSICIAN:  Pieter Partridge, MD   DATE OF BIRTH:  1965/09/23  DATE OF PROCEDURE:  01/16/2011 DATE OF DISCHARGE:  01/16/2011                              OPERATIVE REPORT   PREOPERATIVE DIAGNOSIS:  Abnormal uterine bleeding, uterine fibroids.  POSTOP DIAGNOSIS:  Abnormal uterine bleeding, uterine fibroids.  PROCEDURE:  Hysteroscopy with hydrothermal ablation.  SURGEON:  Pieter Partridge, MD.  PHYSICIAN ASSISTANT:  None.  ASSISTANT:  Pensions consultant.  ANESTHESIA:  LMA.  ESTIMATED BLOOD LOSS:  5 and 1300.  BLOOD ADMINISTERED:  None.  DRAINS:  None.  1% lidocaine, used 7 mL.  SPECIMEN:  None.  [name].  PLAN OF CARE:  To discharge home.  The patient disposition to PACU, hemodynamically stable.  FINDINGS:  Large endometrial cavity.  No submucosal fibroids noted. Slightly distorted.  Right ostia not visualized.  PROCEDURE IN DETAIL:  [name] was identified in the holding area. All questions were answered.  She was then taken to the operating room where she was placed in a dorsal supine position and underwent LMA anesthesia without difficulty.  She was then placed in the dorsal lithotomy position and prepped and draped in a normal sterile fashion. The patient had emptied her bladder prior to coming back, so an I and O cath was not performed.  A Graves speculum was inserted.  The patient's cervix was very anterior and the vagina was long. So the Graves speculum did seem to be the best option.  I tried to put in the short weighted and that was a little challenging, so I put in the Graves speculum, identified the anterior lip of the cervix.  About 3 mL of lidocaine was injected, and the single- tooth tenaculum was used to grasp the lip and then I was able to pull the cervix to me to get full visualization.  The  remaining portion of the lidocaine was used to inject another 4 at 8 o'clock position.  The cervix was then dilated up to an #8 Hegar and the HCA device with a hysteroscope was attached and advanced through the cervical os and the findings above were noted.  After evaluating the endometrial cavity the device was returned to the midcavity portion and HCA ablation was activated and proceeded without difficulty.  Once the device was taken off, the ablation was complete.  All instruments were removed from the uterus.  The single-tooth tenaculum was removed, no bleeding was noted, and the Graves speculum was also removed.  The patient tolerated the procedure well, and she was taken to the recovery room in stable condition.  She received Ancef 2 g IV prior to the procedure, and she had on SCDs throughout the whole case.     Pieter Partridge, MD     EBV/MEDQ  D:  01/16/2011  T:  01/16/2011  Job:  [id number]

## 2011-02-01 ENCOUNTER — Other Ambulatory Visit (HOSPITAL_COMMUNITY)
Admission: RE | Admit: 2011-02-01 | Discharge: 2011-02-01 | Disposition: A | Discharge status: Home / Self Care | Payer: BC Managed Care – PPO | Source: Ambulatory Visit | Attending: Obstetrics and Gynecology | Admitting: Obstetrics and Gynecology

## 2011-02-01 ENCOUNTER — Other Ambulatory Visit: Payer: Self-pay | Admitting: Obstetrics and Gynecology

## 2011-02-01 DIAGNOSIS — Z01419 Encounter for gynecological examination (general) (routine) without abnormal findings: Secondary | ICD-10-CM | POA: Insufficient documentation

## 2011-04-23 ENCOUNTER — Other Ambulatory Visit: Payer: Self-pay | Admitting: Family Medicine

## 2011-04-23 DIAGNOSIS — Z1231 Encounter for screening mammogram for malignant neoplasm of breast: Secondary | ICD-10-CM

## 2011-05-06 ENCOUNTER — Ambulatory Visit
Admission: RE | Admit: 2011-05-06 | Discharge: 2011-05-06 | Disposition: A | Discharge status: Home / Self Care | Payer: BC Managed Care – PPO | Source: Ambulatory Visit | Attending: Family Medicine | Admitting: Family Medicine

## 2011-05-06 DIAGNOSIS — Z1231 Encounter for screening mammogram for malignant neoplasm of breast: Secondary | ICD-10-CM

## 2012-01-25 ENCOUNTER — Emergency Department (HOSPITAL_COMMUNITY): Payer: BC Managed Care – PPO

## 2012-01-25 ENCOUNTER — Emergency Department (HOSPITAL_COMMUNITY)
Admission: EM | Admit: 2012-01-25 | Discharge: 2012-01-25 | Disposition: A | Discharge status: Home / Self Care | Payer: BC Managed Care – PPO | Attending: Emergency Medicine | Admitting: Emergency Medicine

## 2012-01-25 ENCOUNTER — Encounter (HOSPITAL_COMMUNITY): Payer: Self-pay | Admitting: Emergency Medicine

## 2012-01-25 DIAGNOSIS — R0600 Dyspnea, unspecified: Secondary | ICD-10-CM

## 2012-01-25 DIAGNOSIS — R0989 Other specified symptoms and signs involving the circulatory and respiratory systems: Secondary | ICD-10-CM | POA: Insufficient documentation

## 2012-01-25 DIAGNOSIS — I10 Essential (primary) hypertension: Secondary | ICD-10-CM | POA: Insufficient documentation

## 2012-01-25 DIAGNOSIS — Z79899 Other long term (current) drug therapy: Secondary | ICD-10-CM | POA: Insufficient documentation

## 2012-01-25 DIAGNOSIS — Z862 Personal history of diseases of the blood and blood-forming organs and certain disorders involving the immune mechanism: Secondary | ICD-10-CM | POA: Insufficient documentation

## 2012-01-25 DIAGNOSIS — R0609 Other forms of dyspnea: Secondary | ICD-10-CM | POA: Insufficient documentation

## 2012-01-25 DIAGNOSIS — R0601 Orthopnea: Secondary | ICD-10-CM | POA: Insufficient documentation

## 2012-01-25 DIAGNOSIS — F411 Generalized anxiety disorder: Secondary | ICD-10-CM | POA: Insufficient documentation

## 2012-01-25 LAB — CBC
HCT: 37.9 % (ref 36.0–46.0)
Hemoglobin: 12 g/dL (ref 12.0–15.0)
MCH: 25.2 pg — ABNORMAL LOW (ref 26.0–34.0)
MCHC: 31.7 g/dL (ref 30.0–36.0)
MCV: 79.6 fL (ref 78.0–100.0)
Platelets: 231 10*3/uL (ref 150–400)
RBC: 4.76 MIL/uL (ref 3.87–5.11)
RDW: 15.6 % — ABNORMAL HIGH (ref 11.5–15.5)
WBC: 11.6 10*3/uL — ABNORMAL HIGH (ref 4.0–10.5)

## 2012-01-25 LAB — BASIC METABOLIC PANEL
BUN: 11 mg/dL (ref 6–23)
CO2: 29 mEq/L (ref 19–32)
Calcium: 9.1 mg/dL (ref 8.4–10.5)
Chloride: 102 mEq/L (ref 96–112)
Creatinine, Ser: 0.81 mg/dL (ref 0.50–1.10)
GFR calc Af Amer: >90 mL/min (ref >90)
GFR calc non Af Amer: 86 mL/min — ABNORMAL LOW (ref >90)
Glucose, Bld: 127 mg/dL — ABNORMAL HIGH (ref 70–99)
Potassium: 3.2 mEq/L — ABNORMAL LOW (ref 3.5–5.1)
Sodium: 140 mEq/L (ref 135–145)

## 2012-01-25 LAB — PRO B NATRIURETIC PEPTIDE: Pro B Natriuretic peptide (BNP): 19.2 pg/mL (ref 0–125)

## 2012-01-25 MED ORDER — ALPRAZOLAM 0.25 MG PO TABS
0.5000 mg | ORAL_TABLET | Freq: Once | ORAL | Status: AC
  Administered 2012-01-25: 0.5 mg via ORAL
  Filled 2012-01-25: qty 1

## 2012-01-25 MED ORDER — ALPRAZOLAM 0.25 MG PO TABS: 0.2500 mg | ORAL_TABLET | Freq: Three times a day (TID) | ORAL | Status: DC | PRN

## 2012-01-25 MED ORDER — POTASSIUM CHLORIDE CRYS ER 20 MEQ PO TBCR
40.0000 meq | EXTENDED_RELEASE_TABLET | Freq: Once | ORAL | Status: AC
  Administered 2012-01-25: 40 meq via ORAL
  Filled 2012-01-25: qty 2

## 2012-01-25 NOTE — ED Notes (Signed)
Pt ambulated without complaints of nausea or dizziness. Pt pulse ox remained at 97% during ambulating.

## 2012-01-25 NOTE — ED Notes (Signed)
The pt is still waiting for the edp to see.  alsleep until i walked in

## 2012-01-25 NOTE — ED Notes (Signed)
2-view  Xray requested port done ??? reason

## 2012-01-25 NOTE — ED Notes (Signed)
Med given.  Alert waiting for a disposition

## 2012-01-25 NOTE — ED Notes (Addendum)
Pt reports SOB since 1030pm, denies CP, n/v; reports having a dry throat and difficulty getting air in--took a benadryl; pt in NAD, able to communicate in complete sentences with no difficulty; pt's lungs clear throughout

## 2012-01-25 NOTE — ED Notes (Signed)
The pt already has had an ekg done on arrival to the ed

## 2012-01-25 NOTE — ED Provider Notes (Signed)
History     CSN: [id number]  Arrival date & time 01/25/12  0021   First MD Initiated Contact with Patient 01/25/12 0126      Chief Complaint  Patient presents with  . Shortness of Breath    (Consider location/radiation/quality/duration/timing/severity/associated sxs/prior treatment) HPI Comments: Crystal Mercado presents for evaluation of shortness of breath.  She reports having difficulty laying down tonight to go to sleep.  She states she felt SOB and anxious.  She denies any fever, sore throat, post-nasal drip, cough, CP, palpitations, or abdominal pain.  She has recently gained some weight but she denies leg swelling, calf tenderness, recent travel, hormone therapy/supplementation, or history of CHF.  Patient is a 46 y.o. female presenting with shortness of breath. The history is provided by the patient. No language interpreter was used.  Shortness of Breath  The current episode started today (2200). The problem occurs continuously. The problem has been gradually improving. The problem is mild. The symptoms are relieved by rest. Exacerbated by: laying flat. Associated symptoms include orthopnea and shortness of breath. Pertinent negatives include no chest pain, no chest pressure, no fever, no rhinorrhea, no sore throat, no stridor, no cough and no wheezing.    Past Medical History  Diagnosis Date  . Hypertension   . Shortness of breath     with exercise - no meds- r/t weight per pt.  . Anemia     Past Surgical History  Procedure Date  . Thyroid lobectomy 03/2001     left side  . Bilateral macromastia 09/2005    reduction  . Cesarean section     x 3  . Cholecystectomy   . Tubal ligation   . Breast reduction surgery     History reviewed. No pertinent family history.  History  Substance Use Topics  . Smoking status: Never Smoker   . Smokeless tobacco: Never Used  . Alcohol Use: Yes     Comment: occasion    OB History    Grav Para Term Preterm Abortions TAB SAB Ect  Mult Living                  Review of Systems  Constitutional: Negative for fever.  HENT: Negative for sore throat and rhinorrhea.   Respiratory: Positive for shortness of breath. Negative for cough, wheezing and stridor.   Cardiovascular: Positive for orthopnea. Negative for chest pain.  All other systems reviewed and are negative.    Allergies  Shrimp  Home Medications   Current Outpatient Rx  Name  Route  Sig  Dispense  Refill  . SPIRONOLACTONE-HCTZ 25-25 MG PO TABS   Oral   Take 1 tablet by mouth daily.         Marland Kitchen VERAPAMIL HCL ER 240 MG PO TBCR   Oral   Take 240 mg by mouth daily.             BP 161/78  Pulse 82  Temp 98.4 F (36.9 C) (Oral)  Resp 18  SpO2 99%  Physical Exam  Nursing note and vitals reviewed. Constitutional: She is oriented to person, place, and time. She appears well-developed and well-nourished. No distress. She is not intubated.  HENT:  Head: Normocephalic and atraumatic.  Right Ear: External ear normal.  Left Ear: External ear normal.  Nose: Nose normal.  Mouth/Throat: Oropharynx is clear and moist. No oropharyngeal exudate.  Eyes: Conjunctivae normal are normal. Pupils are equal, round, and reactive to light. Right eye exhibits no discharge. Left eye  exhibits no discharge. No scleral icterus.  Neck: Normal range of motion. Neck supple. No JVD present. No tracheal deviation present.  Cardiovascular: Normal rate, regular rhythm, normal heart sounds and intact distal pulses.  Exam reveals no gallop and no friction rub.   No murmur heard. Pulmonary/Chest: Effort normal and breath sounds normal. No accessory muscle usage or stridor. No apnea, not tachypneic and not bradypneic. She is not intubated. No respiratory distress. She has no decreased breath sounds. She has no wheezes. She has no rhonchi. She has no rales. She exhibits no tenderness.  Abdominal: Soft. Bowel sounds are normal. She exhibits no distension and no mass. There is no  tenderness. There is no rebound and no guarding.       Obese, protuberant abdomen   Musculoskeletal: Normal range of motion. She exhibits no edema and no tenderness.  Lymphadenopathy:    She has no cervical adenopathy.  Neurological: She is alert and oriented to person, place, and time. No cranial nerve deficit.  Skin: Skin is warm and dry. No rash noted. She is not diaphoretic. No erythema. No pallor.  Psychiatric: Her behavior is normal.    ED Course  Procedures (including critical care time)   Labs Reviewed  PRO B NATRIURETIC PEPTIDE  CBC  BASIC METABOLIC PANEL   No results found.   No diagnosis found.   Date: 01/25/2012  Rate: 87 bpm  Rhythm: normal sinus rhythm  QRS Axis: left  Intervals: normal  ST/T Wave abnormalities: normal  Conduction Disutrbances:none  Narrative Interpretation:   Old EKG Reviewed: note prolonged PR int on previous study.     MDM  Pt presents for evaluation of SOB.  She also describes some orthopnea.  She currently appears comfortable, note elevated BP, NAD.  Will obtain a CXR, basic labs, pBNP, and EKG.  Will reassess.  She has a distant history of acute bronchitis but has no wheezing or bronchospasm currently.  9528.  Pt appears comfortable, NAD.  Note nl respiratory effort.  She states she feels anxious and a sensation as if there is something in her left nostril.  No FB, mucosal swelling, or tenderness appreciated.  Note mild hypokalemia but otherwise unremarkable labs and CXR.  Will administer po xanax and reassess in 30 minutes.  0700.  Pt  Stable, NAD.  S/p po xanax, anxiety has resolved.  She denies any SOB.  Plan discharge home to follow-up closely with her PMD.  There has been no evidence of PNA or CHF.  Her EKG demonstrates no dysrhythmia or evidence of acute ischemia.    Tobin Chad, MD 01/25/12 [phone]

## 2012-01-25 NOTE — ED Notes (Signed)
edp in to see pt 

## 2012-01-25 NOTE — ED Notes (Signed)
Pt given juice per request.

## 2012-01-25 NOTE — ED Notes (Signed)
The pt had one other episode of the same complaints

## 2012-01-25 NOTE — ED Notes (Signed)
The pt has had sob since 2200 tonight.  She has no history of asthma or copd at present no  resp distress.  Pt alert family at the bedside

## 2012-02-03 ENCOUNTER — Other Ambulatory Visit (HOSPITAL_COMMUNITY)
Admission: RE | Admit: 2012-02-03 | Discharge: 2012-02-03 | Disposition: A | Discharge status: Home / Self Care | Payer: BC Managed Care – PPO | Source: Ambulatory Visit | Attending: Obstetrics and Gynecology | Admitting: Obstetrics and Gynecology

## 2012-02-03 ENCOUNTER — Other Ambulatory Visit: Payer: Self-pay | Admitting: Obstetrics and Gynecology

## 2012-02-03 DIAGNOSIS — Z1151 Encounter for screening for human papillomavirus (HPV): Secondary | ICD-10-CM | POA: Insufficient documentation

## 2012-02-03 DIAGNOSIS — Z01419 Encounter for gynecological examination (general) (routine) without abnormal findings: Secondary | ICD-10-CM | POA: Insufficient documentation

## 2012-02-06 ENCOUNTER — Other Ambulatory Visit: Payer: Self-pay | Admitting: Obstetrics and Gynecology

## 2012-02-06 DIAGNOSIS — N6009 Solitary cyst of unspecified breast: Secondary | ICD-10-CM

## 2012-02-12 ENCOUNTER — Ambulatory Visit
Admission: RE | Admit: 2012-02-12 | Discharge: 2012-02-12 | Disposition: A | Discharge status: Home / Self Care | Payer: BC Managed Care – PPO | Source: Ambulatory Visit | Attending: Obstetrics and Gynecology | Admitting: Obstetrics and Gynecology

## 2012-02-12 DIAGNOSIS — N6009 Solitary cyst of unspecified breast: Secondary | ICD-10-CM

## 2012-02-22 ENCOUNTER — Encounter (HOSPITAL_BASED_OUTPATIENT_CLINIC_OR_DEPARTMENT_OTHER): Payer: Self-pay | Admitting: *Deleted

## 2012-02-22 ENCOUNTER — Emergency Department (HOSPITAL_BASED_OUTPATIENT_CLINIC_OR_DEPARTMENT_OTHER)
Admission: EM | Admit: 2012-02-22 | Discharge: 2012-02-22 | Disposition: A | Discharge status: Home / Self Care | Payer: BC Managed Care – PPO | Attending: Emergency Medicine | Admitting: Emergency Medicine

## 2012-02-22 DIAGNOSIS — Z79899 Other long term (current) drug therapy: Secondary | ICD-10-CM | POA: Insufficient documentation

## 2012-02-22 DIAGNOSIS — Z791 Long term (current) use of non-steroidal anti-inflammatories (NSAID): Secondary | ICD-10-CM | POA: Insufficient documentation

## 2012-02-22 DIAGNOSIS — Z8639 Personal history of other endocrine, nutritional and metabolic disease: Secondary | ICD-10-CM | POA: Insufficient documentation

## 2012-02-22 DIAGNOSIS — R209 Unspecified disturbances of skin sensation: Secondary | ICD-10-CM | POA: Insufficient documentation

## 2012-02-22 DIAGNOSIS — I10 Essential (primary) hypertension: Secondary | ICD-10-CM

## 2012-02-22 DIAGNOSIS — Z862 Personal history of diseases of the blood and blood-forming organs and certain disorders involving the immune mechanism: Secondary | ICD-10-CM | POA: Insufficient documentation

## 2012-02-22 DIAGNOSIS — IMO0001 Reserved for inherently not codable concepts without codable children: Secondary | ICD-10-CM

## 2012-02-22 DIAGNOSIS — H538 Other visual disturbances: Secondary | ICD-10-CM | POA: Insufficient documentation

## 2012-02-22 HISTORY — DX: Nontoxic goiter, unspecified: E04.9

## 2012-02-22 LAB — CBC WITH DIFFERENTIAL/PLATELET
Basophils Absolute: 0 10*3/uL (ref 0.0–0.1)
Basophils Relative: 0 % (ref 0–1)
Eosinophils Absolute: 0.2 10*3/uL (ref 0.0–0.7)
Eosinophils Relative: 2 % (ref 0–5)
HCT: 44.2 % (ref 36.0–46.0)
Hemoglobin: 14.2 g/dL (ref 12.0–15.0)
Lymphocytes Relative: 24 % (ref 12–46)
Lymphs Abs: 2.5 10*3/uL (ref 0.7–4.0)
MCH: 25.6 pg — ABNORMAL LOW (ref 26.0–34.0)
MCHC: 32.1 g/dL (ref 30.0–36.0)
MCV: 79.8 fL (ref 78.0–100.0)
Monocytes Absolute: 0.8 10*3/uL (ref 0.1–1.0)
Monocytes Relative: 8 % (ref 3–12)
Neutro Abs: 7.1 10*3/uL (ref 1.7–7.7)
Neutrophils Relative %: 66 % (ref 43–77)
Platelets: 244 10*3/uL (ref 150–400)
RBC: 5.54 MIL/uL — ABNORMAL HIGH (ref 3.87–5.11)
RDW: 16.2 % — ABNORMAL HIGH (ref 11.5–15.5)
WBC: 10.7 10*3/uL — ABNORMAL HIGH (ref 4.0–10.5)

## 2012-02-22 LAB — BASIC METABOLIC PANEL
BUN: 16 mg/dL (ref 6–23)
CO2: 26 mEq/L (ref 19–32)
Calcium: 9.9 mg/dL (ref 8.4–10.5)
Chloride: 100 mEq/L (ref 96–112)
Creatinine, Ser: 0.9 mg/dL (ref 0.50–1.10)
GFR calc Af Amer: 88 mL/min — ABNORMAL LOW (ref >90)
GFR calc non Af Amer: 76 mL/min — ABNORMAL LOW (ref >90)
Glucose, Bld: 119 mg/dL — ABNORMAL HIGH (ref 70–99)
Potassium: 3.5 mEq/L (ref 3.5–5.1)
Sodium: 138 mEq/L (ref 135–145)

## 2012-02-22 LAB — URINALYSIS, ROUTINE W REFLEX MICROSCOPIC
Glucose, UA: NEGATIVE mg/dL
Hgb urine dipstick: NEGATIVE
Ketones, ur: NEGATIVE mg/dL
Leukocytes, UA: NEGATIVE
Nitrite: NEGATIVE
Protein, ur: 30 mg/dL — AB
Specific Gravity, Urine: 1.018 (ref 1.005–1.030)
Urobilinogen, UA: 1 mg/dL (ref 0.0–1.0)
pH: 8.5 — ABNORMAL HIGH (ref 5.0–8.0)

## 2012-02-22 LAB — URINE MICROSCOPIC-ADD ON

## 2012-02-22 NOTE — ED Notes (Signed)
EDPA Josh at bedside. 

## 2012-02-22 NOTE — ED Provider Notes (Signed)
History     CSN: [id number]  Arrival date & time 02/22/12  1818   First MD Initiated Contact with Patient 02/22/12 1858      Chief Complaint  Patient presents with  . Neck Pain  . Numbness    (Consider location/radiation/quality/duration/timing/severity/associated sxs/prior treatment) HPI Comments: Patient with history of pseudotumor cerebri, previously on acetazolamide but not currently -- presents with complaint of "a stretching sensation in my neck" for the past week, and acute onset of left facial numbness, described as when 'a dentist numbs your face' that started acutely at 5 PM and is now improving but not gone. She denies having headache. She's had some mild blurry vision in her eyes. No vision loss. No facial weakness or facial droop. No numbness, tingling, or weakness in her lower extremities. No speech difficulty. She denies fever chills, cold symptoms, chest pain or shortness of breath. Patient states that she received a therapeutic lumbar puncture in emergency department several years ago with similar symptoms which improved. She has also seen a neurologist. Onset acute. Course is improving. Nothing makes symptoms better or worse.   The history is provided by the patient.    Past Medical History  Diagnosis Date  . Hypertension   . Shortness of breath     with exercise - no meds- r/t weight per pt.  . Anemia   . Goiter     Past Surgical History  Procedure Date  . Thyroid lobectomy 03/2001     left side  . Bilateral macromastia 09/2005    reduction  . Cesarean section     x 3  . Cholecystectomy   . Tubal ligation   . Breast reduction surgery     No family history on file.  History  Substance Use Topics  . Smoking status: Never Smoker   . Smokeless tobacco: Never Used  . Alcohol Use: Yes     Comment: occasion    OB History    Grav Para Term Preterm Abortions TAB SAB Ect Mult Living                  Review of Systems  Constitutional: Negative for  fever.  HENT: Positive for neck pain ('pulling sensation'). Negative for congestion, rhinorrhea, neck stiffness, dental problem and sinus pressure.   Eyes: Positive for visual disturbance (mild). Negative for photophobia, discharge and redness.  Respiratory: Negative for shortness of breath.   Cardiovascular: Negative for chest pain.  Gastrointestinal: Negative for nausea and vomiting.  Musculoskeletal: Negative for gait problem.  Skin: Negative for rash.  Neurological: Positive for numbness. Negative for syncope, speech difficulty, weakness, light-headedness and headaches.  Psychiatric/Behavioral: Negative for confusion.    Allergies  Shrimp  Home Medications   Current Outpatient Rx  Name  Route  Sig  Dispense  Refill  . ACETAZOLAMIDE 250 MG PO TABS      250 mg.         . ALPRAZOLAM 0.25 MG PO TABS   Oral   Take 1 tablet (0.25 mg total) by mouth 3 (three) times daily as needed for anxiety.   15 tablet   0   . IBUPROFEN 600 MG PO TABS   Oral   Take 600 mg by mouth every 6 (six) hours as needed.         Marland Kitchen POTASSIUM CHLORIDE CRYS ER 20 MEQ PO TBCR   Oral   Take 20 mEq by mouth daily.         Marland Kitchen SPIRONOLACTONE-HCTZ  25-25 MG PO TABS   Oral   Take 1 tablet by mouth daily.         Marland Kitchen VERAPAMIL HCL ER 240 MG PO TBCR   Oral   Take 240 mg by mouth daily.             BP 156/91  Pulse 74  Temp 98.3 F (36.8 C) (Oral)  Resp 13  Ht 5\' 2"  (1.575 m)  Wt 239 lb (108.41 kg)  BMI 43.71 kg/m2  SpO2 98%  LMP 02/12/2012  Physical Exam  Nursing note and vitals reviewed. Constitutional: She is oriented to person, place, and time. She appears well-developed and well-nourished.  HENT:  Head: Normocephalic and atraumatic.  Right Ear: Tympanic membrane, external ear and ear canal normal.  Left Ear: Tympanic membrane, external ear and ear canal normal.  Nose: Nose normal.  Mouth/Throat: Uvula is midline, oropharynx is clear and moist and mucous membranes are normal.    Eyes: Conjunctivae normal, EOM and lids are normal. Pupils are equal, round, and reactive to light. Right eye exhibits no nystagmus. Left eye exhibits no nystagmus.  Fundoscopic exam:      The right eye shows no papilledema.       The left eye shows no papilledema.  Neck: Normal range of motion. Neck supple.       No meningismus  Cardiovascular: Normal rate and regular rhythm.   Pulmonary/Chest: Effort normal and breath sounds normal.  Abdominal: Soft. There is no tenderness.  Musculoskeletal:       Cervical back: She exhibits normal range of motion, no tenderness and no bony tenderness.  Neurological: She is alert and oriented to person, place, and time. She has normal strength and normal reflexes. A sensory deficit (patient has sensation L cheek and chin, but states it feels dull) is present. No cranial nerve deficit. She displays a negative Romberg sign. Coordination and gait normal. GCS eye subscore is 4. GCS verbal subscore is 5. GCS motor subscore is 6.  Skin: Skin is warm and dry.  Psychiatric: She has a normal mood and affect.    ED Course  Procedures (including critical care time)  Labs Reviewed  CBC WITH DIFFERENTIAL - Abnormal; Notable for the following:    WBC 10.7 (*)     RBC 5.54 (*)     MCH 25.6 (*)     RDW 16.2 (*)     All other components within normal limits  BASIC METABOLIC PANEL - Abnormal; Notable for the following:    Glucose, Bld 119 (*)     GFR calc non Af Amer 76 (*)     GFR calc Af Amer 88 (*)     All other components within normal limits  URINALYSIS, ROUTINE W REFLEX MICROSCOPIC - Abnormal; Notable for the following:    pH 8.5 (*)     Bilirubin Urine LARGE (*)     Protein, ur 30 (*)     All other components within normal limits  URINE MICROSCOPIC-ADD ON - Abnormal; Notable for the following:    Squamous Epithelial / LPF FEW (*)     All other components within normal limits   No results found.   1. Paresthesias/numbness   2. Hypertension      7:18 PM Patient seen and examined. Work-up initiated. Medications ordered. Previous medical records reviewed. D/w Dr. Lynelle Doctor.   Vital signs reviewed and are as follows: Filed Vitals:   02/22/12 1900  BP: 156/91  Pulse: 74  Temp: 98.3  F (36.8 C)  Resp: 13   Patient informed of all results. Symptoms are completely resolved and patient is feeling better. She has neurology follow-up. There are no indications for emergent LP tonight. Patient comfortable, ambulating well.   Patient counseled to return if they have weakness in their arms or legs, slurred speech, trouble walking or talking, confusion, trouble with their balance, or if they have any other concerns. Patient verbalizes understanding and agrees with plan.   BP 156/91  Pulse 74  Temp 98.3 F (36.8 C) (Oral)  Resp 13  Ht 5\' 2"  (1.575 m)  Wt 239 lb (108.41 kg)  BMI 43.71 kg/m2  SpO2 98%  LMP 02/12/2012  Patient was informed of high blood pressure reading today.  Patient was counseled about long-term health problems hypertension can cause and was urged to follow-up with a primary care doctor in the next week for a blood pressure recheck.  Patient verbalized understanding.    MDM  Patient with numbness/paresthesias of face, sensation in neck. Symptoms resolved. Doubt TIA given previous symptoms/diagnosis of idiopathic pseudotumor cerebri.  Do not feel patient warrants admission or work-up given this previous history. She has been given appropriate return instructions and seems reliable. She has neurologist. Patient appears well.         Renne Crigler, [place] 02/23/12 1226

## 2012-02-22 NOTE — ED Notes (Signed)
Pt states "numbness is not as bad now as when i checked in"

## 2012-02-22 NOTE — ED Notes (Addendum)
C/o neck and shoulder pain since Monday- states "i feel pressure in the back of my head" "feels like something stretching in my shoulders"- states left side of face feels numb x 1 hour (since 5 pm), gradual onset- head pressure also- facial symmetry present- grips equal- no drift- speech clear- cao x 4

## 2012-02-22 NOTE — ED Notes (Addendum)
EDP Linwood Dibbles updated on pt status and bp

## 2012-02-24 NOTE — ED Provider Notes (Signed)
Medical screening examination/treatment/procedure(s) were performed by non-physician practitioner and as supervising physician I was immediately available for consultation/collaboration.    Erna Brossard R Yisel Megill, MD 02/24/12 0805 

## 2012-03-30 ENCOUNTER — Other Ambulatory Visit: Payer: Self-pay | Admitting: Obstetrics and Gynecology

## 2012-05-05 ENCOUNTER — Other Ambulatory Visit: Payer: Self-pay | Admitting: Obstetrics and Gynecology

## 2012-07-07 ENCOUNTER — Other Ambulatory Visit: Payer: Self-pay | Admitting: Obstetrics and Gynecology

## 2012-07-07 DIAGNOSIS — Z803 Family history of malignant neoplasm of breast: Secondary | ICD-10-CM

## 2012-08-06 ENCOUNTER — Ambulatory Visit
Admission: RE | Admit: 2012-08-06 | Discharge: 2012-08-06 | Disposition: A | Discharge status: Home / Self Care | Payer: BC Managed Care – PPO | Source: Ambulatory Visit | Attending: Obstetrics and Gynecology | Admitting: Obstetrics and Gynecology

## 2012-08-06 DIAGNOSIS — Z803 Family history of malignant neoplasm of breast: Secondary | ICD-10-CM

## 2012-08-06 MED ORDER — GADOBENATE DIMEGLUMINE 529 MG/ML IV SOLN
20.0000 mL | Freq: Once | INTRAVENOUS | Status: AC | PRN
  Administered 2012-08-06: 20 mL via INTRAVENOUS

## 2012-08-10 ENCOUNTER — Other Ambulatory Visit: Payer: Self-pay | Admitting: Obstetrics and Gynecology

## 2012-08-10 DIAGNOSIS — R928 Other abnormal and inconclusive findings on diagnostic imaging of breast: Secondary | ICD-10-CM

## 2012-08-24 ENCOUNTER — Ambulatory Visit
Admission: RE | Admit: 2012-08-24 | Discharge: 2012-08-24 | Disposition: A | Discharge status: Home / Self Care | Payer: BC Managed Care – PPO | Source: Ambulatory Visit | Attending: Obstetrics and Gynecology | Admitting: Obstetrics and Gynecology

## 2012-08-24 DIAGNOSIS — R928 Other abnormal and inconclusive findings on diagnostic imaging of breast: Secondary | ICD-10-CM

## 2012-09-02 ENCOUNTER — Other Ambulatory Visit: Payer: Self-pay | Admitting: Family Medicine

## 2012-09-02 DIAGNOSIS — R928 Other abnormal and inconclusive findings on diagnostic imaging of breast: Secondary | ICD-10-CM

## 2012-09-03 ENCOUNTER — Ambulatory Visit
Admission: RE | Admit: 2012-09-03 | Discharge: 2012-09-03 | Disposition: A | Discharge status: Home / Self Care | Payer: BC Managed Care – PPO | Source: Ambulatory Visit | Attending: Obstetrics and Gynecology | Admitting: Obstetrics and Gynecology

## 2012-09-03 ENCOUNTER — Other Ambulatory Visit: Payer: BC Managed Care – PPO

## 2012-09-03 DIAGNOSIS — R928 Other abnormal and inconclusive findings on diagnostic imaging of breast: Secondary | ICD-10-CM

## 2012-09-03 MED ORDER — GADOBENATE DIMEGLUMINE 529 MG/ML IV SOLN
19.0000 mL | Freq: Once | INTRAVENOUS | Status: AC | PRN
Start: 2012-09-03 — End: 2012-09-03
  Administered 2012-09-03: 19 mL via INTRAVENOUS

## 2013-01-11 ENCOUNTER — Emergency Department (HOSPITAL_COMMUNITY): Payer: BC Managed Care – PPO

## 2013-01-11 ENCOUNTER — Emergency Department (HOSPITAL_COMMUNITY)
Admission: EM | Admit: 2013-01-11 | Discharge: 2013-01-12 | Disposition: A | Discharge status: Home / Self Care | Payer: BC Managed Care – PPO | Attending: Emergency Medicine | Admitting: Emergency Medicine

## 2013-01-11 ENCOUNTER — Encounter (HOSPITAL_COMMUNITY): Payer: Self-pay | Admitting: Emergency Medicine

## 2013-01-11 DIAGNOSIS — K219 Gastro-esophageal reflux disease without esophagitis: Secondary | ICD-10-CM

## 2013-01-11 DIAGNOSIS — R5381 Other malaise: Secondary | ICD-10-CM | POA: Insufficient documentation

## 2013-01-11 DIAGNOSIS — R110 Nausea: Secondary | ICD-10-CM | POA: Insufficient documentation

## 2013-01-11 DIAGNOSIS — Z79899 Other long term (current) drug therapy: Secondary | ICD-10-CM | POA: Insufficient documentation

## 2013-01-11 DIAGNOSIS — R42 Dizziness and giddiness: Secondary | ICD-10-CM | POA: Insufficient documentation

## 2013-01-11 DIAGNOSIS — I10 Essential (primary) hypertension: Secondary | ICD-10-CM | POA: Insufficient documentation

## 2013-01-11 DIAGNOSIS — D649 Anemia, unspecified: Secondary | ICD-10-CM | POA: Insufficient documentation

## 2013-01-11 DIAGNOSIS — E669 Obesity, unspecified: Secondary | ICD-10-CM | POA: Insufficient documentation

## 2013-01-11 DIAGNOSIS — R0789 Other chest pain: Secondary | ICD-10-CM

## 2013-01-11 LAB — GLUCOSE, CAPILLARY: Glucose-Capillary: 103 mg/dL — ABNORMAL HIGH (ref 70–99)

## 2013-01-11 LAB — URINALYSIS, ROUTINE W REFLEX MICROSCOPIC
Glucose, UA: NEGATIVE mg/dL
Hgb urine dipstick: NEGATIVE
Ketones, ur: 15 mg/dL — AB
Nitrite: NEGATIVE
Protein, ur: NEGATIVE mg/dL
Specific Gravity, Urine: 1.027 (ref 1.005–1.030)
Urobilinogen, UA: 0.2 mg/dL (ref 0.0–1.0)
pH: 5.5 (ref 5.0–8.0)

## 2013-01-11 LAB — COMPREHENSIVE METABOLIC PANEL WITH GFR
ALT: 23 U/L (ref 0–35)
AST: 23 U/L (ref 0–37)
Albumin: 3.8 g/dL (ref 3.5–5.2)
Alkaline Phosphatase: 105 U/L (ref 39–117)
BUN: 16 mg/dL (ref 6–23)
CO2: 25 meq/L (ref 19–32)
Calcium: 9.7 mg/dL (ref 8.4–10.5)
Chloride: 97 meq/L (ref 96–112)
Creatinine, Ser: 0.95 mg/dL (ref 0.50–1.10)
GFR calc Af Amer: 82 mL/min — ABNORMAL LOW
GFR calc non Af Amer: 71 mL/min — ABNORMAL LOW
Glucose, Bld: 102 mg/dL — ABNORMAL HIGH (ref 70–99)
Potassium: 3.9 meq/L (ref 3.5–5.1)
Sodium: 137 meq/L (ref 135–145)
Total Bilirubin: 0.9 mg/dL (ref 0.3–1.2)
Total Protein: 8.6 g/dL — ABNORMAL HIGH (ref 6.0–8.3)

## 2013-01-11 LAB — POCT I-STAT TROPONIN I: Troponin i, poc: 0.01 ng/mL (ref 0.00–0.08)

## 2013-01-11 LAB — URINE MICROSCOPIC-ADD ON

## 2013-01-11 LAB — CBC WITH DIFFERENTIAL/PLATELET
Basophils Absolute: 0 10*3/uL (ref 0.0–0.1)
Basophils Relative: 0 % (ref 0–1)
Eosinophils Absolute: 0.2 10*3/uL (ref 0.0–0.7)
Eosinophils Relative: 2 % (ref 0–5)
HCT: 42.4 % (ref 36.0–46.0)
Hemoglobin: 13.9 g/dL (ref 12.0–15.0)
Lymphocytes Relative: 22 % (ref 12–46)
Lymphs Abs: 2.5 10*3/uL (ref 0.7–4.0)
MCH: 26.5 pg (ref 26.0–34.0)
MCHC: 32.8 g/dL (ref 30.0–36.0)
MCV: 80.9 fL (ref 78.0–100.0)
Monocytes Absolute: 0.8 10*3/uL (ref 0.1–1.0)
Monocytes Relative: 7 % (ref 3–12)
Neutro Abs: 8.3 10*3/uL — ABNORMAL HIGH (ref 1.7–7.7)
Neutrophils Relative %: 70 % (ref 43–77)
Platelets: 284 10*3/uL (ref 150–400)
RBC: 5.24 MIL/uL — ABNORMAL HIGH (ref 3.87–5.11)
RDW: 14.8 % (ref 11.5–15.5)
WBC: 11.8 10*3/uL — ABNORMAL HIGH (ref 4.0–10.5)

## 2013-01-11 LAB — LIPASE, BLOOD: Lipase: 15 U/L (ref 11–59)

## 2013-01-11 MED ORDER — GI COCKTAIL ~~LOC~~
30.0000 mL | Freq: Once | ORAL | Status: AC
  Administered 2013-01-11: 30 mL via ORAL
  Filled 2013-01-11: qty 30

## 2013-01-11 MED ORDER — ONDANSETRON 4 MG PO TBDP
8.0000 mg | ORAL_TABLET | Freq: Once | ORAL | Status: AC
  Administered 2013-01-11: 8 mg via ORAL
  Filled 2013-01-11: qty 2

## 2013-01-11 MED ORDER — PANTOPRAZOLE SODIUM 40 MG PO TBEC
40.0000 mg | DELAYED_RELEASE_TABLET | Freq: Once | ORAL | Status: AC
  Administered 2013-01-11: 40 mg via ORAL
  Filled 2013-01-11: qty 1

## 2013-01-11 NOTE — ED Notes (Addendum)
C/o cp, nausea, light headedness, tired, seeing spots, onset last night, thought it was heartburn, took tums and it went away, sx were present this am, ongoing all day, describes CP as "discomfort", 3/10, alert, NAD, calm, interactive, resps e/u, speaking in clear complete sentences. Took ASA81mg  this am, took ASA 650mg  at 1700.

## 2013-01-11 NOTE — ED Provider Notes (Signed)
CSN: [id number]     Arrival date & time 01/11/13  1921 History   First MD Initiated Contact with Patient 01/11/13 2302     Chief Complaint  Patient presents with  . Chest Pain  . Nausea  . Dizziness   (Consider location/radiation/quality/duration/timing/severity/associated sxs/prior Treatment) HPI 47 year old female presents to emergency room from home with complaint of chest discomfort.  Patient reports she had a burning sensation in her chest last night, for which she took [place].  Today she has had a fullness sensation in her chest associated with nausea, fatigue, and mild dizziness.  Discomfort has been present throughout the day Patient has taken aspirin without improvement.  When systems persisted, she got concerned and came to the emergency department.  Patient has history of hypertension.  Father died of MI.  She is not a smoker.  No leg swelling, no shortness of breath, no hormones.  Patient reports she has a history of reflux, for which she takes when necessary Tums, and over-the-counter acid reducing medication.  She has had poor by mouth intake today due to nausea.  Patient had episode of white spots in front of her eyes.  This evening just prior to arrival.  She closed her eyes and the white spots, resolved.      Past Medical History  Diagnosis Date  . Hypertension   . Shortness of breath     with exercise - no meds- r/t weight per pt.  . Anemia   . Goiter    Past Surgical History  Procedure Laterality Date  . Thyroid lobectomy  03/2001     left side  . Bilateral macromastia  09/2005    reduction  . Cesarean section      x 3  . Cholecystectomy    . Tubal ligation    . Breast reduction surgery     No family history on file. History  Substance Use Topics  . Smoking status: Never Smoker   . Smokeless tobacco: Never Used  . Alcohol Use: Yes     Comment: occasion   OB History   Grav Para Term Preterm Abortions TAB SAB Ect Mult Living                 Review of  Systems  See History of Present Illness; otherwise all other systems are reviewed and negative Allergies  Shrimp  Home Medications   Current Outpatient Rx  Name  Route  Sig  Dispense  Refill  . acetaZOLAMIDE (DIAMOX) 250 MG tablet   Oral   Take 250 mg by mouth daily as needed (swelling).          . ALPRAZolam (XANAX) 0.25 MG tablet   Oral   Take 1 tablet (0.25 mg total) by mouth 3 (three) times daily as needed for anxiety.   15 tablet   0   . etodolac (LODINE) 400 MG tablet   Oral   Take 400 mg by mouth 2 (two) times daily.         . ferrous sulfate 325 (65 FE) MG tablet   Oral   Take 325 mg by mouth daily with breakfast.         . gabapentin (NEURONTIN) 300 MG capsule   Oral   Take 300 mg by mouth 3 (three) times daily.         Marland Kitchen ibuprofen (ADVIL,MOTRIN) 600 MG tablet   Oral   Take 600 mg by mouth every 6 (six) hours as needed.         Marland Kitchen  potassium chloride SA (K-DUR,KLOR-CON) 20 MEQ tablet   Oral   Take 20 mEq by mouth daily.         Marland Kitchen spironolactone-hydrochlorothiazide (ALDACTAZIDE) 25-25 MG per tablet   Oral   Take 1 tablet by mouth daily.         . verapamil (CALAN-SR) 240 MG CR tablet   Oral   Take 240 mg by mouth daily.            BP 134/99  Pulse 95  Temp(Src) 98.1 F (36.7 C) (Oral)  Resp 17  SpO2 98%  LMP 01/12/2012 Physical Exam  Nursing note and vitals reviewed. Constitutional: She is oriented to person, place, and time. She appears well-developed and well-nourished.  Obese female, in no acute distress  HENT:  Head: Normocephalic and atraumatic.  Nose: Nose normal.  Mouth/Throat: Oropharynx is clear and moist.  Eyes: Conjunctivae and EOM are normal. Pupils are equal, round, and reactive to light.  Neck: Normal range of motion. Neck supple. No JVD present. No tracheal deviation present. No thyromegaly present.  Cardiovascular: Normal rate, regular rhythm, normal heart sounds and intact distal pulses.  Exam reveals no gallop  and no friction rub.   No murmur heard. Pulmonary/Chest: Effort normal and breath sounds normal. No stridor. No respiratory distress. She has no wheezes. She has no rales. She exhibits no tenderness.  Abdominal: Soft. Bowel sounds are normal. She exhibits no distension and no mass. There is no tenderness. There is no rebound and no guarding.  Musculoskeletal: Normal range of motion. She exhibits no edema and no tenderness.  Lymphadenopathy:    She has no cervical adenopathy.  Neurological: She is alert and oriented to person, place, and time. She exhibits normal muscle tone. Coordination normal.  Skin: Skin is warm and dry. No rash noted. No erythema. No pallor.  Psychiatric: She has a normal mood and affect. Her behavior is normal. Judgment and thought content normal.    ED Course  Procedures (including critical care time) Labs Review Labs Reviewed  COMPREHENSIVE METABOLIC PANEL - Abnormal; Notable for the following:    Glucose, Bld 102 (*)    Total Protein 8.6 (*)    GFR calc non Af Amer 71 (*)    GFR calc Af Amer 82 (*)    All other components within normal limits  CBC WITH DIFFERENTIAL - Abnormal; Notable for the following:    WBC 11.8 (*)    RBC 5.24 (*)    Neutro Abs 8.3 (*)    All other components within normal limits  URINALYSIS, ROUTINE W REFLEX MICROSCOPIC - Abnormal; Notable for the following:    APPearance CLOUDY (*)    Bilirubin Urine SMALL (*)    Ketones, ur 15 (*)    Leukocytes, UA TRACE (*)    All other components within normal limits  GLUCOSE, CAPILLARY - Abnormal; Notable for the following:    Glucose-Capillary 103 (*)    All other components within normal limits  URINE MICROSCOPIC-ADD ON - Abnormal; Notable for the following:    Squamous Epithelial / LPF FEW (*)    Bacteria, UA FEW (*)    Casts HYALINE CASTS (*)    All other components within normal limits  LIPASE, BLOOD  POCT I-STAT TROPONIN I   Imaging Review Dg Chest 2 View  01/11/2013   CLINICAL  DATA:  Chest pain, nausea  EXAM: CHEST  2 VIEW  COMPARISON:  01/25/2012  FINDINGS: The heart size and mediastinal contours are within normal  limits. Both lungs are clear. The visualized skeletal structures are unremarkable.  IMPRESSION: No active cardiopulmonary disease.   Electronically Signed   By: Esperanza Heir M.D.   On: 01/11/2013 20:26    EKG Interpretation    Date/Time:  Monday January 11 2013 19:25:29 EST Ventricular Rate:  88 PR Interval:  180 QRS Duration: 96 QT Interval:  368 QTC Calculation: 445 R Axis:   -45 Text Interpretation:  Normal sinus rhythm Possible Left atrial enlargement Left anterior fascicular block Abnormal ECG Confirmed by Johnathin Vanderschaaf  MD, Humna Moorehouse ([id number]) on 01/11/2013 11:08:06 PM            MDM   1. Atypical chest pain   2. GERD (gastroesophageal reflux disease)    47 year old female with chest pressure throughout the day today.  Symptoms do not seem consistent with ACS, unstable angina.  Patient has risk factor of hypertension, and family history.  I feel if second troponin is negative, she can followup with primary care Dr. for further risk stratification as outpatient.  Will treat for reflux symptoms.    Olivia Mackie, MD 01/12/13 [phone]

## 2013-01-11 NOTE — ED Notes (Signed)
C/o feeling of fullness in chest all day along with nausea.  Around 1700 tonight saw white spots that lasted approx 5 min.  Closed eyes and sat for a few minutes, they went away.  Unable to eat without increased nausea.

## 2013-01-11 NOTE — ED Notes (Signed)
Slight nausea remains but fullness in chest is gone.

## 2013-01-12 LAB — POCT I-STAT TROPONIN I: Troponin i, poc: 0 ng/mL (ref 0.00–0.08)

## 2013-01-12 MED ORDER — ONDANSETRON 8 MG PO TBDP: 8.0000 mg | ORAL_TABLET | Freq: Three times a day (TID) | ORAL | Status: DC | PRN

## 2013-01-12 MED ORDER — PANTOPRAZOLE SODIUM 20 MG PO TBEC: 40.0000 mg | DELAYED_RELEASE_TABLET | Freq: Every day | ORAL | Status: DC

## 2013-01-21 ENCOUNTER — Other Ambulatory Visit: Payer: Self-pay | Admitting: Diagnostic Neuroimaging

## 2013-03-16 ENCOUNTER — Encounter: Payer: BC Managed Care – PPO | Attending: Family Medicine

## 2013-03-16 VITALS — Ht 62.0 in | Wt 236.1 lb

## 2013-03-16 DIAGNOSIS — Z713 Dietary counseling and surveillance: Secondary | ICD-10-CM | POA: Insufficient documentation

## 2013-03-16 DIAGNOSIS — E119 Type 2 diabetes mellitus without complications: Secondary | ICD-10-CM

## 2013-03-18 NOTE — Progress Notes (Signed)
Patient was seen on 03/15/13 for the first of a series of three diabetes self-management courses at the [facility].  Current HbA1c: 7.0%  The following learning objectives were met by the patient during this class:  Describe diabetes  State some common risk factors for diabetes  Defines the role of glucose and insulin  Identifies type of diabetes and pathophysiology  Describe the relationship between diabetes and cardiovascular risk  State the members of the Healthcare Team  States the rationale for glucose monitoring  State when to test glucose  State their individual Target Range  State the importance of logging glucose readings  Describe how to interpret glucose readings  Identifies A1C target  Explain the correlation between A1c and eAG values  State symptoms and treatment of high blood glucose  State symptoms and treatment of low blood glucose  Explain proper technique for glucose testing  Identifies proper sharps disposal  Handouts given during class include:  Living Well with Diabetes book  Carb Counting and Meal Planning book  Meal Plan Card  Carbohydrate guide  Meal planning worksheet  Low Sodium Flavoring Tips  The diabetes portion plate  A4T to eAG Conversion Chart  Diabetes Medications  Diabetes Recommended Care Schedule  Support Group  Diabetes Success Plan  Core Class Satisfaction Survey  Follow-Up Plan:  Attend core 2

## 2013-03-23 ENCOUNTER — Ambulatory Visit: Payer: BC Managed Care – PPO

## 2013-03-25 ENCOUNTER — Ambulatory Visit: Payer: BC Managed Care – PPO

## 2013-03-25 DIAGNOSIS — E119 Type 2 diabetes mellitus without complications: Secondary | ICD-10-CM

## 2013-03-26 NOTE — Progress Notes (Signed)

## 2013-03-30 DIAGNOSIS — E119 Type 2 diabetes mellitus without complications: Secondary | ICD-10-CM

## 2013-03-30 NOTE — Progress Notes (Signed)
Patient was seen on 03/30/13 for the third of a series of three diabetes self-management courses at the [facility]. The following learning objectives were met by the patient during this class:    State the amount of activity recommended for healthy living   Describe activities suitable for individual needs   Identify ways to regularly incorporate activity into daily life   Identify barriers to activity and ways to over come these barriers  Identify diabetes medications being personally used and their primary action for lowering glucose and possible side effects   Describe role of stress on blood glucose and develop strategies to address psychosocial issues   Identify diabetes complications and ways to prevent them  Explain how to manage diabetes during illness   Evaluate success in meeting personal goal   Establish 2-3 goals that they will plan to diligently work on until they return for the  21-monthfollow-up visit  Goals:  Follow Diabetes Meal Plan as instructed  Aim for 15-30 mins of physical activity daily as tolerated  Bring food record and glucose log to your follow up visit  Your patient has established the following 4 month goals in their individualized success plan: I will count my carb choices at most meals and snacks I will increase my activity level at least 3 days a week for 60 minutes  Your patient has identified these potential barriers to change:  Time management,   preparing meals ahead of time  Your patient has identified their diabetes self-care support plan as  [facility]

## 2013-07-05 ENCOUNTER — Encounter: Payer: BC Managed Care – PPO | Attending: Family Medicine

## 2013-07-05 DIAGNOSIS — E119 Type 2 diabetes mellitus without complications: Secondary | ICD-10-CM | POA: Insufficient documentation

## 2013-07-05 DIAGNOSIS — Z713 Dietary counseling and surveillance: Secondary | ICD-10-CM | POA: Insufficient documentation

## 2013-07-06 NOTE — Progress Notes (Signed)
Patient was seen on 07/05/2013 for a review of the series of three diabetes self-management courses at the [facility]. The following learning objectives were met by the patient during this class:    Reviewed blood glucose monitoring and interpretation including the recommended target ranges and Hgb A1c.    Reviewed on carb counting, importance of regularly scheduled meals/snacks, and meal planning.    Reviewed the effects of physical activity on glucose levels and long-term glucose control.  Recommended goal of 150 minutes of physical activity/week.   Reviewed patient medications and discussed role of medication on blood glucose and possible side effects.   Discussed strategies to manage stress, psychosocial issues, and other obstacles to diabetes management.   Encouraged moderate weight reduction to improve glucose levels.     Reviewed short-term complications: hyper- and hypo-glycemia.  Discussed causes, symptoms, and treatment options.   Reviewed prevention, detection, and treatment of long-term complications.  Discussed the role of prolonged elevated glucose levels on body systems.  Goals:  Follow Diabetes Meal Plan as instructed  Eat 3 meals and 2 snacks, every 3-5 hrs  Limit carbohydrate intake to 45 grams carbohydrate/meal Limit carbohydrate intake to 15 grams carbohydrate/snack Add lean protein foods to meals/snacks  Monitor glucose levels as instructed by your doctor  Aim for goal of 15-30 mins of physical activity daily as tolerated  Bring food record and glucose log to your next nutrition visit   

## 2013-07-12 ENCOUNTER — Other Ambulatory Visit: Payer: Self-pay | Admitting: Diagnostic Neuroimaging

## 2013-09-12 ENCOUNTER — Other Ambulatory Visit: Payer: Self-pay | Admitting: Diagnostic Neuroimaging

## 2014-01-31 ENCOUNTER — Other Ambulatory Visit: Payer: Self-pay | Admitting: Obstetrics and Gynecology

## 2014-01-31 ENCOUNTER — Other Ambulatory Visit (HOSPITAL_COMMUNITY)
Admission: RE | Admit: 2014-01-31 | Discharge: 2014-01-31 | Disposition: A | Discharge status: Home / Self Care | Payer: BC Managed Care – PPO | Source: Ambulatory Visit | Attending: Obstetrics and Gynecology | Admitting: Obstetrics and Gynecology

## 2014-01-31 DIAGNOSIS — Z01419 Encounter for gynecological examination (general) (routine) without abnormal findings: Secondary | ICD-10-CM | POA: Insufficient documentation

## 2014-01-31 DIAGNOSIS — N6009 Solitary cyst of unspecified breast: Secondary | ICD-10-CM

## 2014-02-01 LAB — CYTOLOGY - PAP

## 2014-02-02 ENCOUNTER — Telehealth: Payer: Self-pay | Admitting: Genetic Counselor

## 2014-02-02 NOTE — Telephone Encounter (Signed)
LEFT MESSAGE FOR PATIENT TO RETURN CALL TO SCHEDULE GENETIC APPT.  °

## 2014-02-03 ENCOUNTER — Telehealth: Payer: Self-pay | Admitting: Genetic Counselor

## 2014-02-03 NOTE — Telephone Encounter (Signed)
S/W PATIENT AND GAVE GENETIC APPT FOR 01/13 @ 9 Crystal ClontsW/Crystal Mercado

## 2014-02-09 ENCOUNTER — Ambulatory Visit
Admission: RE | Admit: 2014-02-09 | Discharge: 2014-02-09 | Disposition: A | Discharge status: Home / Self Care | Payer: BLUE CROSS/BLUE SHIELD | Source: Ambulatory Visit | Attending: Obstetrics and Gynecology | Admitting: Obstetrics and Gynecology

## 2014-02-09 DIAGNOSIS — N6009 Solitary cyst of unspecified breast: Secondary | ICD-10-CM

## 2014-02-16 ENCOUNTER — Other Ambulatory Visit: Payer: BLUE CROSS/BLUE SHIELD

## 2014-02-16 ENCOUNTER — Encounter: Payer: Self-pay | Admitting: Genetic Counselor

## 2014-02-16 ENCOUNTER — Ambulatory Visit (HOSPITAL_BASED_OUTPATIENT_CLINIC_OR_DEPARTMENT_OTHER): Payer: BLUE CROSS/BLUE SHIELD | Admitting: Genetic Counselor

## 2014-02-16 DIAGNOSIS — Z803 Family history of malignant neoplasm of breast: Secondary | ICD-10-CM

## 2014-02-16 DIAGNOSIS — Z315 Encounter for genetic counseling: Secondary | ICD-10-CM

## 2014-02-16 DIAGNOSIS — Z8041 Family history of malignant neoplasm of ovary: Secondary | ICD-10-CM

## 2014-02-16 NOTE — Progress Notes (Signed)
REFERRING PROVIDER: Kelton Pillar, MD [address] [place], [place] [address]  Thurnell Lose, MD  PRIMARY PROVIDER:  Osborne Casco, MD  PRIMARY REASON FOR VISIT:  1. Family history of breast cancer   2. Family history of ovarian cancer      HISTORY OF PRESENT ILLNESS:   Ms. Yerian, a 49 y.o. female, was seen for a [facility] cancer genetics consultation at the request of Dr. Laurann Montana due to a family history of cancer.  Ms. Bares presents to clinic today to discuss the possibility of a hereditary predisposition to cancer, genetic testing, and to further clarify her future cancer risks, as well as potential cancer risks for family members.   CANCER HISTORY:   No history exists.     HISTORY OF PRESENT ILLNESS: Ms. Keetch is a 49 y.o. female with no personal history of cancer.  Ms. Aggarwal reports that all of her sisters have had genetic testing, but she does not know if it was for BRCA only, or if additional panel testing was performed.  She thinks that they all tested negative, but does not have copies of their testing to ensure that.  Ms. Yaklin is s/p breast reduction surgery.  HORMONAL RISK FACTORS:  Menarche was at age 45.  First live birth at age 59.  OCP use for approximately 11 years.  Ovaries intact: yes.  Hysterectomy: no.  Menopausal status: perimenopausal.  HRT use: 0 years. Colonoscopy: no; not examined. Mammogram within the last year: yes. Number of breast biopsies: 1. Up to date with pelvic exams:  yes. Any excessive radiation exposure in the past:  no  Past Medical History  Diagnosis Date  . Hypertension   . Shortness of breath     with exercise - no meds- r/t weight per pt.  . Anemia   . Goiter   . Diabetes mellitus without complication   . Family history of breast cancer   . Family history of ovarian cancer     Past Surgical History  Procedure Laterality Date  . Thyroid lobectomy  03/2001     left side  . Bilateral  macromastia  09/2005    reduction  . Cesarean section      x 3  . Cholecystectomy    . Tubal ligation    . Breast reduction surgery      History   Social History  . Marital Status: Married    Spouse Name: Marcello Moores    Number of Children: 3  . Years of Education: N/A   Social History Main Topics  . Smoking status: Never Smoker   . Smokeless tobacco: Never Used  . Alcohol Use: Yes     Comment: occasion  . Drug Use: No  . Sexual Activity: Yes    Birth Control/ Protection: Surgical   Other Topics Concern  . None   Social History Narrative     FAMILY HISTORY:  We obtained a detailed, 4-generation family history.  Significant diagnoses are listed below: Family History  Problem Relation Age of Onset  . Breast cancer Mother 4  . Hypertension Father   . CAD Father   . Breast cancer Sister 74  . Hypertension Brother   . Ovarian cancer Maternal Aunt   . Stomach cancer Maternal Grandmother     unsure if this is the actual cancer  . Hypertension Brother   . Hypertension Brother   . Ovarian cancer Cousin     maternal first cousin dx in her 74s  Ms. Townsend has four brothers and three sisters.  One sister had breast cancer around age 56, but no other siblings, and no nieces or nephews have cancer.  Ms. Bradfield mother had breast cancer at 59.  Her mother had two brothers and three sisters.  One sister had ovarian cancer and another sister's daughter had ovarian cancer (Ms Newkirk originally thought it was colon cancer but corrected that after talking with her sister).  Her maternal grandmother possibly had stomach cancer.  Patient's maternal ancestors are of [place] American descent, and paternal ancestors are of [place] and [place] descent. There is no reported Ashkenazi Jewish ancestry. There is no known consanguinity.  GENETIC COUNSELING ASSESSMENT: DEMETRESS TIFT is a 49 y.o. female with a family history of breast and ovarian cancer which somewhat suggestive  of a hereditary cancer syndrome and predisposition to cancer. We, therefore, discussed and recommended the following at today's visit.   DISCUSSION: We reviewed the characteristics, features and inheritance patterns of hereditary cancer syndromes. We reviewed hereditary cancer syndromes, specifically BRCA mutations.  We also discussed genetic testing, including the appropriate family members to test, the process of testing, insurance coverage and turn-around-time for results. We discussed the implications of a negative, positive and/or variant of uncertain significant result. We recommended Ms. Shimizu pursue genetic testing for the OvaNext gene panel. The OvaNext gene panel offered by [facility] and includes sequencing and rearrangement analysis for the following 24 genes: ATM, BARD1, BRCA1, BRCA2, BRIP1, CDH1, CHEK2, EPCAM, MLH1, MRE11A, MSH2, MSH6, MUTYH, NBN, NF1, PALB2, PMS2, PTEN, RAD50, RAD51C, RAD51D, SMARCA4, STK11, and TP53.   PLAN: After considering the risks, benefits, and limitations,Ms. Hollander  provided informed consent to pursue genetic testing and the blood sample was sent to [facility] for analysis of the OvaNext panel test. Results should be available within approximately 3-4 weeks' time, at which point they will be disclosed by telephone to Ms. Arreaga, as will any additional recommendations warranted by these results. Ms. Fray will receive a summary of her genetic counseling visit and a copy of her results once available. This information will also be available in Epic. We encouraged Ms. Sharman to remain in contact with cancer genetics annually so that we can continuously update the family history and inform her of any changes in cancer genetics and testing that may be of benefit for her family. Ms. Winkles questions were answered to her satisfaction today. Our contact information was provided should additional questions or concerns arise.  Lastly, we encouraged  Ms. Goshorn to remain in contact with cancer genetics annually so that we can continuously update the family history and inform her of any changes in cancer genetics and testing that may be of benefit for this family.   Ms.  Bansal questions were answered to her satisfaction today. Our contact information was provided should additional questions or concerns arise. Thank you for the referral and allowing [place] to share in the care of your patient.   Elexa Kivi P. Florene Glen, [place], [facility] Certified Genetic Counselor [email or web address] phone: [phone]  The patient was seen for a total of 60 minutes in face-to-face genetic counseling.  This patient was discussed with Drs. Magrinat, Lindi Adie and/or Burr Medico who agrees with the above.    _______________________________________________________________________ For Office Staff:  Number of people involved in session: 1 Was an Intern/ student involved with case: no

## 2014-03-14 ENCOUNTER — Telehealth: Payer: Self-pay | Admitting: Genetic Counselor

## 2014-03-14 ENCOUNTER — Encounter: Payer: Self-pay | Admitting: Genetic Counselor

## 2014-03-14 DIAGNOSIS — Z1379 Encounter for other screening for genetic and chromosomal anomalies: Secondary | ICD-10-CM | POA: Insufficient documentation

## 2014-03-14 NOTE — Telephone Encounter (Signed)
Will try to CB.  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

## 2014-03-16 ENCOUNTER — Telehealth: Payer: Self-pay | Admitting: Genetic Counselor

## 2014-03-16 ENCOUNTER — Encounter: Payer: Self-pay | Admitting: Genetic Counselor

## 2014-03-16 NOTE — Telephone Encounter (Signed)
Revealed BRCA2 VUS, but otherwise her ovanext panel tesitng was negative.  Discussed pursuing family studies on the BRCA2 VUS.  Crystal Mercado was not interested in doing that.

## 2014-03-16 NOTE — Progress Notes (Signed)
HPI: Ms. Crystal Mercado was previously seen in the [place] clinic due to a family history of cancer and concerns regarding a hereditary predisposition to cancer. Please refer to our prior cancer genetics clinic note for more information regarding Ms. Crystal Mercado's medical, social and family histories, and our assessment and recommendations, at the time. Ms. Crystal Mercado recent genetic test results were disclosed to her, as were recommendations warranted by these results. These results and recommendations are discussed in more detail below.  GENETIC TEST RESULTS: At the time of Ms. Crystal Mercado's visit, we recommended she pursue genetic testing of the OvaNext gene panel. The OvaNext gene panel offered by [facility] and includes sequencing and rearrangement analysis for the following 24 genes: ATM, BARD1, BRCA1, BRCA2, BRIP1, CDH1, CHEK2, EPCAM, MLH1, MRE11A, MSH2, MSH6, MUTYH, NBN, NF1, PALB2, PMS2, PTEN, RAD50, RAD51C, RAD51D, SMARCA4, STK11, and TP53.  The report date is March 12, 2014.  Testing was performed at [facility]. Genetic testing revealed a BRCA2 [id number] VUS, but was otherwise normal, and did not reveal a deleterious mutation in these genes. The test report has been scanned into EPIC and is located under the Media tab.   We discussed with Ms. Crystal Mercado that since the current genetic testing is not perfect, it is possible there may be a gene mutation in one of these genes that current testing cannot detect, but that chance is small. We also discussed, that it is possible that another gene that has not yet been discovered, or that we have not yet tested, is responsible for the cancer diagnoses in the family, and it is, therefore, important to remain in touch with cancer genetics in the future so that we can continue to offer Ms. Crystal Mercado the most up to date genetic testing.   The option of pursuing family studies was discussed with Ms. Crystal Mercado. Family studies, if approved by  the lab, can help track a variant through a family and determine if the variant tracks with individuals with cancer or not. Sometimes this can help with the reclassification process of the variant. If approved, complementary genetic testing would be offered to informative individuals determined by the genetic testing laboratory.  Ms. Crystal Mercado declined the option of family studies.  CANCER SCREENING RECOMMENDATIONS: This result is reassuring and suggests that Ms. Crystal Mercado' is not at increased risk for developing cancer due to an inherited predisposition associated with one of these genes. Most cancers happen by chance and this negative test, along with details of her family history, suggests that if she developed cancer it would fall into this category. We, therefore, recommended she continue to follow the cancer management and screening guidelines provided by her oncology and primary providers.   RECOMMENDATIONS FOR FAMILY MEMBERS: Women in this family might be at some increased risk of developing cancer, over the general population risk, simply due to the family history of cancer. We recommended women in this family have a yearly mammogram beginning at age 67, or 26 years younger than the earliest onset of cancer, an an annual clinical breast exam, and perform monthly breast self-exams. Women in this family should also have a gynecological exam as recommended by their primary provider. All family members should have a colonoscopy by age 32.  FOLLOW-UP: Lastly, we discussed with Ms. Crystal Mercado that cancer genetics is a rapidly advancing field and it is possible that new genetic tests will be appropriate for her and/or her family members in the future. We encouraged her to remain in contact with cancer  genetics on an annual basis so we can update her personal and family histories and let her know of advances in cancer genetics that may benefit this family.   Our contact number was provided. Ms. Crystal Mercado  questions were answered to her satisfaction, and she knows she is welcome to call us at anytime with additional questions or concerns.   Roma Kayser, MS, [facility] Certified Genetic Counselor [email or web address]

## 2014-06-21 ENCOUNTER — Ambulatory Visit (INDEPENDENT_AMBULATORY_CARE_PROVIDER_SITE_OTHER): Payer: BLUE CROSS/BLUE SHIELD | Admitting: Neurology

## 2014-06-21 ENCOUNTER — Encounter: Payer: Self-pay | Admitting: Neurology

## 2014-06-21 VITALS — BP 126/88 | HR 70 | Resp 16 | Ht 62.0 in | Wt 225.2 lb

## 2014-06-21 DIAGNOSIS — H4710 Unspecified papilledema: Secondary | ICD-10-CM

## 2014-06-21 DIAGNOSIS — G4489 Other headache syndrome: Secondary | ICD-10-CM | POA: Diagnosis not present

## 2014-06-21 DIAGNOSIS — R519 Headache, unspecified: Secondary | ICD-10-CM | POA: Insufficient documentation

## 2014-06-21 DIAGNOSIS — G932 Benign intracranial hypertension: Secondary | ICD-10-CM | POA: Insufficient documentation

## 2014-06-21 DIAGNOSIS — R42 Dizziness and giddiness: Secondary | ICD-10-CM | POA: Diagnosis not present

## 2014-06-21 DIAGNOSIS — H539 Unspecified visual disturbance: Secondary | ICD-10-CM

## 2014-06-21 DIAGNOSIS — R51 Headache: Secondary | ICD-10-CM

## 2014-06-21 MED ORDER — ACETAZOLAMIDE 125 MG PO TABS: 125.0000 mg | ORAL_TABLET | Freq: Three times a day (TID) | ORAL | Status: DC

## 2014-06-21 MED ORDER — GABAPENTIN 300 MG PO CAPS: 300.0000 mg | ORAL_CAPSULE | Freq: Three times a day (TID) | ORAL | Status: DC

## 2014-06-21 NOTE — Progress Notes (Signed)
[facility]  PATIENT: Crystal Mercado DOB: 1965/10/07  REFERRING DOCTOR OR PCP:  Kelton Pillar SOURCE: patient and records and CT images from PACS  _________________________________   HISTORICAL  CHIEF COMPLAINT:  Chief Complaint  Patient presents with  . Dizziness    Sts. onset 2 weeks ago of intermittent dizziness, blurry vison, neck pain, "pressure" feeling in her head.  Sts. she was seen by her pcp, told wbc's were elevated but that she didn't need an antiobiotic.  Sts. she saw Dr. Leta Baptist 2 yrs. ago with similar sx. and was dx. with pseudotumor cerebri.  Sts. sx. resolved at that time after lp was done at [facility]  . Visual Disturbance    HISTORY OF PRESENT ILLNESS:  I had the pleasure seeing your patient, Crystal Mercado, at [facility] for neurologic consultation regarding her to go and visual disturbance and headache. As you know, [name] is a 49 year old woman who has a history of pseudotumor cerebri and has had similar headache and visual disturbance in the past.     On Thursday 2 weeks ago, she got up and felt very dizzy.    The dizziness occurs when she changes positions or moves too fast.   The dizziness is described as a sensation of vertigo and lasts one minute.  She does not note if her eyes are jerking at the time. She has a sensation of nausea that is present all the time, not necessarily just when she feels dizzy.     Yesterday, she had the onset of a pressure sensation in the left side of her head combined with blurry vision and neck pain. The left-sided head pressure and neck pain became bilateral shortly after it started.  She feels her blurry vision is about the same both sides.   She hears a pulsing sensation in the right ear.     About 30 years ago she had double vision and headache and was found to have papilledema. She underwent a lumbar puncture and had resolution of her symptoms. She did very well for  many years. In 2011,  she presented to the emergency room with headache and visual field changes. A CT scan was performed that I personally reviewed. It was normal. She had a lumbar puncture and she felt better for 6 months. Then, she was referred to Dr. Corwin Levins when symptoms returned. She had similar blurry vision, neck pain and a pressure sensation in the head but did not have any of the dizziness. At that time, she had a lumbar puncture and was diagnosed with pseudotumor cerebri. After the lumbar puncture was performed she felt much better.  At that time, she also had papilledema. Visual field testing showed that there was some restricted vision.  She has had some muscle aches and has been placed on gabapentin for possible fibromyalgia. She gets a benefit from the medication.  REVIEW OF SYSTEMS: Constitutional: No fevers, chills, sweats, or change in appetite Eyes: No visual changes, double vision, eye pain Ear, nose and throat: No hearing loss, ear pain, nasal congestion, sore throat Cardiovascular: No chest pain, palpitations Respiratory: No shortness of breath at rest or with exertion.   No wheezes.  Some coughing GastrointestinaI: No nausea, vomiting, diarrhea, abdominal pain, fecal incontinence Genitourinary: No dysuria, urinary retention or frequency.  No nocturia. Musculoskeletal: No neck pain, back pain Integumentary: No rash, pruritus, skin lesions Neurological: as above.   Notes some memory loss associated with HA   Psychiatric: No depression at  this time.  No anxiety Endocrine: No palpitations, diaphoresis, change in appetite, change in weigh or increased thirst Hematologic/Lymphatic: occ mild bruising.  No anemia, purpura, petechiae. Allergic/Immunologic: No itchy/runny eyes, nasal congestion, recent allergic reactions, rashes  ALLERGIES: Allergies  Allergen Reactions  . Shrimp [Shellfish Allergy] Anaphylaxis    HOME MEDICATIONS:  Current outpatient prescriptions:    .  ACCU-CHEK SOFTCLIX LANCETS lancets, , Disp: , Rfl: 4 .  ibuprofen (ADVIL,MOTRIN) 600 MG tablet, Take 600 mg by mouth every 6 (six) hours as needed., Disp: , Rfl:  .  potassium chloride SA (K-DUR,KLOR-CON) 20 MEQ tablet, Take 20 mEq by mouth daily., Disp: , Rfl:  .  simvastatin (ZOCOR) 20 MG tablet, Take 20 mg by mouth every evening., Disp: , Rfl: 5 .  spironolactone-hydrochlorothiazide (ALDACTAZIDE) 25-25 MG per tablet, Take 1 tablet by mouth daily., Disp: , Rfl:  .  verapamil (CALAN-SR) 240 MG CR tablet, Take 240 mg by mouth daily.  , Disp: , Rfl:  .  gabapentin (NEURONTIN) 300 MG capsule, TAKE ONE CAPSULE BY MOUTH AT BEDTIME THEN INCREASE TO 1 CAPSULE 3 TIMES DAILY AS TOLERATED (Patient not taking: Reported on 06/21/2014), Disp: 90 capsule, Rfl: 0  PAST MEDICAL HISTORY: Past Medical History  Diagnosis Date  . Hypertension   . Shortness of breath     with exercise - no meds- r/t weight per pt.  . Anemia   . Goiter   . Diabetes mellitus without complication   . Family history of breast cancer   . Family history of ovarian cancer   . Headache   . Vision abnormalities     PAST SURGICAL HISTORY: Past Surgical History  Procedure Laterality Date  . Thyroid lobectomy  03/2001     left side  . Bilateral macromastia  09/2005    reduction  . Cesarean section      x 3  . Cholecystectomy    . Tubal ligation    . Breast reduction surgery      FAMILY HISTORY: Family History  Problem Relation Age of Onset  . Breast cancer Mother 3  . Hypertension Father   . CAD Father   . Breast cancer Sister 66  . Hypertension Brother   . Ovarian cancer Maternal Aunt   . Stomach cancer Maternal Grandmother     unsure if this is the actual cancer  . Hypertension Brother   . Hypertension Brother   . Ovarian cancer Cousin     maternal first cousin dx in her 18s    SOCIAL HISTORY:  History   Social History  . Marital Status: Married    Spouse Name: Marcello Moores  . Number of Children: 3  .  Years of Education: N/A   Occupational History  . Not on file.   Social History Main Topics  . Smoking status: Never Smoker   . Smokeless tobacco: Never Used  . Alcohol Use: Yes     Comment: occasion  . Drug Use: No  . Sexual Activity: Yes    Birth Control/ Protection: Surgical   Other Topics Concern  . Not on file   Social History Narrative     PHYSICAL EXAM  Filed Vitals:   06/21/14 1522  BP: 126/88  Pulse: 70  Resp: 16  Height: '5\' 2"'  (1.575 m)  Weight: 225 lb 3.2 oz (102.15 kg)    Body mass index is 41.18 kg/(m^2).   General: The patient is well-developed and well-nourished and in no acute distres  HEENT:  Tender over  bilateral temporal arteries. Canals and TM intact.  Funduscopic exam shows right papilledema and milder blurring of disc margin temporally on the left.  No venous pulsations on the right but noted on the left.  Neck: The neck is supple, no carotid bruits are noted.  The neck is tender at occiput and paraspinal muscles  Cardiovascular: The heart has a regular rate and rhythm with a normal S1 and S2. There were no murmurs, gallops or rubs.    Neurologic Exam  Mental status: The patient is alert and oriented x 3 at the time of the examination. The patient has apparent normal recent and remote memory, with an apparently normal attention span and concentration ability.   Speech is normal.  Cranial nerves: Extraocular movements are full. VA is 20/40 OS, OD, OU.  Pupils are equal, round, and reactive to light and accomodation.  Visual fields are full.  Facial symmetry is present. There is good facial sensation to soft touch bilaterally.Facial strength is normal.  Trapezius and sternocleidomastoid strength is normal. No dysarthria is noted.  The tongue is midline, and the patient has symmetric elevation of the soft palate. Hearing is reduced on the right relative to the left. The Weber test did not lateralize.  Motor:  Muscle bulk is normal.   Tone is  normal. Strength is  5 / 5 in all 4 extremities.   Sensory: Sensory testing is intact to pinprick, soft touch and vibration sensation in all 4 extremities.  Coordination: Cerebellar testing reveals good finger-nose-finger bilaterally.  Gait and station: Station is normal.   Gait is normal. Tandem gait is normal. Romberg is negative.   Reflexes: Deep tendon reflexes are symmetric and normal bilaterally.     Gae Bon testing:  Did not evoke nystagmus, only mild dizziness    DIAGNOSTIC DATA (LABS, IMAGING, TESTING) - I reviewed patient records, labs, notes, testing and imaging myself where available.  Lab Results  Component Value Date   WBC 11.8* 01/11/2013   HGB 13.9 01/11/2013   HCT 42.4 01/11/2013   MCV 80.9 01/11/2013   PLT 284 01/11/2013      Component Value Date/Time   NA 137 01/11/2013 1947   K 3.9 01/11/2013 1947   CL 97 01/11/2013 1947   CO2 25 01/11/2013 1947   GLUCOSE 102* 01/11/2013 1947   BUN 16 01/11/2013 1947   CREATININE 0.95 01/11/2013 1947   CALCIUM 9.7 01/11/2013 1947   PROT 8.6* 01/11/2013 1947   ALBUMIN 3.8 01/11/2013 1947   AST 23 01/11/2013 1947   ALT 23 01/11/2013 1947   ALKPHOS 105 01/11/2013 1947   BILITOT 0.9 01/11/2013 1947   GFRNONAA 71* 01/11/2013 1947   GFRAA 82* 01/11/2013 1947       ASSESSMENT AND PLAN  Pseudotumor cerebri - Plan: DG FLUORO GUIDE LUMBAR PUNCTURE, CT Head Wo Contrast  Other headache syndrome - Plan: Sedimentation rate, C-reactive protein, CT Head Wo Contrast  Vertigo  Visual disturbance - Plan: Sedimentation rate, C-reactive protein  Papilledema - Plan: DG FLUORO GUIDE LUMBAR PUNCTURE, CT Head Wo Contrast  In summary, Benny Henrie is a 49 year old woman with a history of pseudotumor cerebri who presents with 2 weeks of vertigo and several days of headache, visual disturbance and neck pain. The vertigohas a positional component but she did not show nystagmus with the [facility] maneuver so the etiology is unclear. The  more recent symptoms of headache and visual disturbance or more likely related to the pseudotumor cerebri and it could possibly explain her vertigo  as well. I will set her up for a lumbar puncture to measure opening pressure over this will be curative as well as as diagnostic. She is placed on Diamox 250 mg 4 times a day. Because she has some tenderness over the temporal arteries, I am going to check ESR and CRP. Temporal arteritis would be unlikely and the pain is likely related to her pseudotumor.  Over with the results of the studies and she will return to see me in 2 months or call sooner if she has new or worsening neurologic symptoms. I will likely want her to get a baseline and I exam around that time with visual field testing to make sure that she is not doing permanent damage to her visual fields. Richard A. Felecia Shelling, MD, PhD 02/15/1622, 4:69 PM Certified in Neurology, Clinical Neurophysiology, Sleep Medicine, Pain Medicine and Neuroimaging  [facility] [address], [place], [place] [address] [phone]

## 2014-06-22 LAB — C-REACTIVE PROTEIN: CRP: 6.5 mg/L — ABNORMAL HIGH (ref 0.0–4.9)

## 2014-06-22 LAB — SEDIMENTATION RATE: Sed Rate: 23 mm/hr (ref 0–32)

## 2014-07-01 ENCOUNTER — Ambulatory Visit
Admission: RE | Admit: 2014-07-01 | Discharge: 2014-07-01 | Disposition: A | Discharge status: Home / Self Care | Payer: BLUE CROSS/BLUE SHIELD | Source: Ambulatory Visit | Attending: Neurology | Admitting: Neurology

## 2014-07-01 DIAGNOSIS — G932 Benign intracranial hypertension: Secondary | ICD-10-CM

## 2014-07-01 DIAGNOSIS — G4489 Other headache syndrome: Secondary | ICD-10-CM

## 2014-07-01 DIAGNOSIS — H4710 Unspecified papilledema: Secondary | ICD-10-CM

## 2014-07-05 ENCOUNTER — Telehealth: Payer: Self-pay

## 2014-07-05 ENCOUNTER — Other Ambulatory Visit: Payer: Self-pay | Admitting: Neurology

## 2014-07-05 ENCOUNTER — Ambulatory Visit
Admission: RE | Admit: 2014-07-05 | Discharge: 2014-07-05 | Disposition: A | Discharge status: Home / Self Care | Payer: BLUE CROSS/BLUE SHIELD | Source: Ambulatory Visit | Attending: Neurology | Admitting: Neurology

## 2014-07-05 VITALS — BP 136/69 | HR 71

## 2014-07-05 DIAGNOSIS — G4489 Other headache syndrome: Secondary | ICD-10-CM

## 2014-07-05 DIAGNOSIS — G932 Benign intracranial hypertension: Secondary | ICD-10-CM

## 2014-07-05 DIAGNOSIS — H4710 Unspecified papilledema: Secondary | ICD-10-CM

## 2014-07-05 LAB — CSF CELL COUNT WITH DIFFERENTIAL
RBC Count, CSF: 3 cu mm — ABNORMAL HIGH
Tube #: 3
WBC, CSF: <10 cu mm (ref 0–5)

## 2014-07-05 LAB — PROTEIN, CSF: Total Protein, CSF: 30 mg/dL (ref 15–45)

## 2014-07-05 LAB — GLUCOSE, CSF: Glucose, CSF: 80 mg/dL — ABNORMAL HIGH (ref 43–76)

## 2014-07-05 NOTE — Discharge Instructions (Signed)

## 2014-07-05 NOTE — Telephone Encounter (Signed)
VM left to inform patient that her CT was normal

## 2014-07-06 ENCOUNTER — Telehealth: Payer: Self-pay | Admitting: *Deleted

## 2014-07-06 ENCOUNTER — Telehealth (HOSPITAL_COMMUNITY): Payer: Self-pay

## 2014-07-06 NOTE — Telephone Encounter (Signed)
LMTC./fim 

## 2014-07-06 NOTE — Telephone Encounter (Signed)
-----   Message from Asa Lenteichard A Sater, MD sent at 07/04/2014  2:01 PM EDT ----- Pleas let her know CT scan looks normal

## 2014-07-06 NOTE — Telephone Encounter (Signed)
-----   Message from Asa Lenteichard A Sater, MD sent at 07/05/2014  4:55 PM EDT ----- Please let her know the lumbar puncture showed elevated pressure assistance with the pseudotumor cerebri that she has had for many years. Hopefully the combination of the lumbar puncture and the Diamox (acetazolamide) will help her symptoms.

## 2014-07-06 NOTE — Telephone Encounter (Signed)
-----   Message from Asa Lenteichard A Sater, MD sent at 07/06/2014  2:27 PM EDT ----- LP showed pressure was high in spinal fluid so Crystal Mercado needs to keep taking diamox (acetazolamide) Spinal fluid labs are fine.  Lets set up visual field testing with ophthalmology (for pseudotumor cerebri) (if Crystal Mercado already has an eye doctor, we can do there, otherwise can schedule with any)

## 2014-07-06 NOTE — Telephone Encounter (Signed)
Encounter complete. 

## 2014-07-07 ENCOUNTER — Inpatient Hospital Stay (HOSPITAL_COMMUNITY): Admission: RE | Admit: 2014-07-07 | Payer: BLUE CROSS/BLUE SHIELD | Source: Ambulatory Visit

## 2014-07-08 ENCOUNTER — Other Ambulatory Visit (HOSPITAL_COMMUNITY): Payer: Self-pay | Admitting: Physician Assistant

## 2014-07-08 ENCOUNTER — Encounter: Payer: BLUE CROSS/BLUE SHIELD | Admitting: Physician Assistant

## 2014-07-08 DIAGNOSIS — R079 Chest pain, unspecified: Secondary | ICD-10-CM

## 2014-07-08 NOTE — Telephone Encounter (Signed)
Attempted to contact Crystal Mercado--phone rang for over one minute with no answer/no ans. machine/fim

## 2014-07-08 NOTE — Telephone Encounter (Signed)
Patient called returning Faith's phone call. Please call and advise. Patient can be reached at [phone].

## 2014-07-13 ENCOUNTER — Encounter: Payer: Self-pay | Admitting: *Deleted

## 2014-07-13 NOTE — Telephone Encounter (Signed)
-----   Message from Richard A Sater, MD sent at 07/06/2014  2:27 PM EDT ----- LP showed pressure was high in spinal fluid so she needs to keep taking diamox (acetazolamide) Spinal fluid labs are fine.  Lets set up visual field testing with ophthalmology (for pseudotumor cerebri) (if she already has an eye doctor, we can do there, otherwise can schedule with any) 

## 2014-07-13 NOTE — Telephone Encounter (Signed)
Unable to contact letter sent to home address/fim 

## 2014-07-13 NOTE — Telephone Encounter (Signed)
-----   Message from Richard A Sater, MD sent at 07/04/2014  2:01 PM EDT ----- Pleas let her know CT scan looks normal 

## 2014-07-13 NOTE — Telephone Encounter (Signed)
Unable to contact letter sent/fim

## 2014-07-13 NOTE — Telephone Encounter (Signed)
Unable to contact letter sent to home address/fim

## 2014-07-13 NOTE — Telephone Encounter (Signed)
-----   Message from Richard A Sater, MD sent at 07/05/2014  4:55 PM EDT ----- Please let her know the lumbar puncture showed elevated pressure assistance with the pseudotumor cerebri that she has had for many years. Hopefully the combination of the lumbar puncture and the Diamox (acetazolamide) will help her symptoms. 

## 2014-07-18 NOTE — Telephone Encounter (Signed)
I have not heard back from pt. despite mult. phone calls, unable to contact letter/fim

## 2014-07-19 ENCOUNTER — Telehealth: Payer: Self-pay | Admitting: *Deleted

## 2014-07-19 NOTE — Telephone Encounter (Signed)
-----   Message from Asa Lente, MD sent at 07/05/2014  4:55 PM EDT ----- Please let her know the lumbar puncture showed elevated pressure assistance with the pseudotumor cerebri that she has had for many years. Hopefully the combination of the lumbar puncture and the Diamox (acetazolamide) will help her symptoms.

## 2014-07-19 NOTE — Telephone Encounter (Signed)
I have spoken with Crystal Mercado this morning and per RAS, advised that lp pressure was high; she should continue Diamox as rx'd. I have also advised that lp labs were ok. I have also advised that RAS would like her to have visual field testing. She is agreeable with this--sts. she has an opthalmologist but can't remember his name at the [facility] will call me back with his name and I will fax him office notes with request for visual field testing./fim

## 2014-07-19 NOTE — Telephone Encounter (Signed)
I have spoken with Crystal Mercado this morning and per RAS, advised that ct head was normal.  She verbalized understanding of same/fim

## 2014-07-19 NOTE — Telephone Encounter (Signed)
-----   Message from Asa Lente, MD sent at 07/04/2014  2:01 PM EDT ----- Pleas let her know CT scan looks normal

## 2014-07-19 NOTE — Telephone Encounter (Signed)
I have spoken with Crystal Mercado this morning and per RAS, advised that lp pressure was high; she should continue Diamox as rx'd. I have also advised that lp labs were ok. I have also advised that RAS would like her to have visual field testing. She is agreeable with this--sts. she has an opthalmologist but can't remember his name at the moment--she will call me back with his name and I will fax him office notes with request for visual field testing./fim 

## 2014-07-19 NOTE — Telephone Encounter (Signed)
duplicate task/fim 

## 2014-07-19 NOTE — Telephone Encounter (Signed)
Patient called returning Crystal Mercado's call. Please call and advise. Patient can be reached at [phone].

## 2014-07-19 NOTE — Telephone Encounter (Signed)
-----   Message from Asa Lente, MD sent at 07/06/2014  2:27 PM EDT ----- LP showed pressure was high in spinal fluid so she needs to keep taking diamox (acetazolamide) Spinal fluid labs are fine.  Lets set up visual field testing with ophthalmology (for pseudotumor cerebri) (if she already has an eye doctor, we can do there, otherwise can schedule with any)

## 2014-07-19 NOTE — Telephone Encounter (Signed)
-----   Message from Richard A Sater, MD sent at 07/06/2014  2:27 PM EDT ----- LP showed pressure was high in spinal fluid so she needs to keep taking diamox (acetazolamide) Spinal fluid labs are fine.  Lets set up visual field testing with ophthalmology (for pseudotumor cerebri) (if she already has an eye doctor, we can do there, otherwise can schedule with any) 

## 2014-08-04 ENCOUNTER — Encounter (HOSPITAL_COMMUNITY): Payer: BLUE CROSS/BLUE SHIELD

## 2014-08-29 ENCOUNTER — Ambulatory Visit: Payer: BLUE CROSS/BLUE SHIELD | Admitting: Neurology

## 2015-03-01 ENCOUNTER — Other Ambulatory Visit: Payer: Self-pay | Admitting: Neurology

## 2015-03-31 ENCOUNTER — Emergency Department (HOSPITAL_COMMUNITY)
Admission: EM | Admit: 2015-03-31 | Discharge: 2015-03-31 | Disposition: A | Discharge status: Home / Self Care | Payer: BLUE CROSS/BLUE SHIELD | Source: Home / Self Care | Attending: Family Medicine | Admitting: Family Medicine

## 2015-03-31 ENCOUNTER — Encounter (HOSPITAL_COMMUNITY): Payer: Self-pay | Admitting: Family Medicine

## 2015-03-31 DIAGNOSIS — J111 Influenza due to unidentified influenza virus with other respiratory manifestations: Secondary | ICD-10-CM

## 2015-03-31 DIAGNOSIS — H6092 Unspecified otitis externa, left ear: Secondary | ICD-10-CM | POA: Diagnosis not present

## 2015-03-31 DIAGNOSIS — H109 Unspecified conjunctivitis: Secondary | ICD-10-CM | POA: Diagnosis not present

## 2015-03-31 MED ORDER — NEOMYCIN-POLYMYXIN-HC 3.5-10000-1 OT SOLN: OTIC | Status: DC

## 2015-03-31 MED ORDER — POLYMYXIN B-TRIMETHOPRIM 10000-0.1 UNIT/ML-% OP SOLN: 2.0000 [drp] | Freq: Four times a day (QID) | OPHTHALMIC | Status: DC

## 2015-03-31 MED ORDER — IBUPROFEN 600 MG PO TABS: 600.0000 mg | ORAL_TABLET | Freq: Four times a day (QID) | ORAL | Status: DC | PRN

## 2015-03-31 NOTE — ED Notes (Signed)
C/o cold sx onset 2/19 associated w/ST, prod cough w/streaks of blood, hoarseness, and BA A&O x4... No acute distress.

## 2015-03-31 NOTE — ED Provider Notes (Signed)
CSN: [id number]     Arrival date & time 03/31/15  1715 History   First MD Initiated Contact with Patient 03/31/15 1757     Chief Complaint  Patient presents with  . URI   (Consider location/radiation/quality/duration/timing/severity/associated sxs/prior Treatment) HPI Raynaud's cough congestion headache chills hot flashes. Started approximately 5-6 days ago. Associated with some throat pain area denies any shortness of breath, chest pain, diarrhea, rash. Has not checked her temperature but may have had some fevers. Alka-Seltzer and Robitussin without improvement. Getting worse overall. Developed red eyes to 3 days ago. Initially just red and watery but now include shot and very irritated with periorbital puffing. Also developed left ear pain and ringing in her ear. Problems are constant.  Past Medical History  Diagnosis Date  . Hypertension   . Shortness of breath     with exercise - no meds- r/t weight per pt.  . Anemia   . Goiter   . Diabetes mellitus without complication (HCC)   . Family history of breast cancer   . Family history of ovarian cancer   . Headache   . Vision abnormalities    Past Surgical History  Procedure Laterality Date  . Thyroid lobectomy  03/2001     left side  . Bilateral macromastia  09/2005    reduction  . Cesarean section      x 3  . Cholecystectomy    . Tubal ligation    . Breast reduction surgery     Family History  Problem Relation Age of Onset  . Breast cancer Mother 45  . Hypertension Father   . CAD Father   . Breast cancer Sister 93  . Hypertension Brother   . Ovarian cancer Maternal Aunt   . Stomach cancer Maternal Grandmother     unsure if this is the actual cancer  . Hypertension Brother   . Hypertension Brother   . Ovarian cancer Cousin     maternal first cousin dx in her 72s   Social History  Substance Use Topics  . Smoking status: Never Smoker   . Smokeless tobacco: Never Used  . Alcohol Use: Yes     Comment: occasion   OB  History    No data available     Review of Systems Per HPI with all other pertinent systems negative.   Allergies  Shrimp  Home Medications   Prior to Admission medications   Medication Sig Start Date End Date Taking? Authorizing Provider  ACCU-CHEK SOFTCLIX LANCETS lancets  03/21/14   Historical Provider, MD  acetaZOLAMIDE (DIAMOX) 125 MG tablet TAKE 1 TABLET (125 MG TOTAL) BY MOUTH 4 (FOUR) TIMES DAILY - AFTER MEALS AND AT BEDTIME. 03/02/15   Asa Lente, MD  gabapentin (NEURONTIN) 300 MG capsule TAKE ONE CAPSULE BY MOUTH AT BEDTIME THEN INCREASE TO 1 CAPSULE 3 TIMES DAILY AS TOLERATED Patient not taking: Reported on 06/21/2014 07/12/13   Suanne Marker, MD  gabapentin (NEURONTIN) 300 MG capsule Take 1 capsule (300 mg total) by mouth 3 (three) times daily. 06/21/14   Asa Lente, MD  ibuprofen (ADVIL,MOTRIN) 600 MG tablet Take 1 tablet (600 mg total) by mouth every 6 (six) hours as needed. 03/31/15   Ozella Rocks, MD  neomycin-polymyxin-hydrocortisone (CORTISPORIN) otic solution 3-4 Drops, 4 times per day for 7 days 03/31/15   Ozella Rocks, MD  potassium chloride SA (K-DUR,KLOR-CON) 20 MEQ tablet Take 20 mEq by mouth daily.    Historical Provider, MD  simvastatin (ZOCOR)  20 MG tablet Take 20 mg by mouth every evening. 05/23/14   Historical Provider, MD  spironolactone-hydrochlorothiazide (ALDACTAZIDE) 25-25 MG per tablet Take 1 tablet by mouth daily.    Historical Provider, MD  trimethoprim-polymyxin b (POLYTRIM) ophthalmic solution Place 2 drops into the left eye every 6 (six) hours. Treat for 5-7 days 03/31/15   Ozella Rocks, MD  verapamil (CALAN-SR) 240 MG CR tablet Take 240 mg by mouth daily.      Historical Provider, MD   Meds Ordered and Administered this Visit  Medications - No data to display  There were no vitals taken for this visit. No data found.   Physical Exam Physical Exam  Constitutional: Ill-appearing but in no acute distress.  HENT: TMs normal  bilaterally. Left external canal with maceration of the skin from the 1 to 5:00 positions Head: Normocephalic and atraumatic.  Eyes: Bilateral scleral injection with periorbital puffiness. Obvious discharge present in the epicanthal folds Neck: Normal range of motion.  Cardiovascular: RRR, no m/r/g, 2+ distal pulses,  Pulmonary/Chest: Effort normal and breath sounds normal. No respiratory distress.  Abdominal: Soft. Bowel sounds are normal. NonTTP, no distension.  Musculoskeletal: Normal range of motion. Non ttp, no effusion.  Neurological: alert and oriented to person, place, and time.  Skin: Skin is warm. No rash noted. non diaphoretic.  Psychiatric: normal mood and affect. behavior is normal. Judgment and thought content normal.   ED Course  Procedures (including critical care time)  Labs Review Labs Reviewed - No data to display  Imaging Review No results found.   Visual Acuity Review  Right Eye Distance:   Left Eye Distance:   Bilateral Distance:    Right Eye Near:   Left Eye Near:    Bilateral Near:         MDM   1. Bilateral conjunctivitis   2. Flu syndrome   3. Otitis externa, left    Zaditor, Polytrim eye drops, Cortisporin eardrops, fluids, NSAIDs, rest. Patient outside the window for Tamiflu.    Ozella Rocks, MD 03/31/15 [phone]

## 2015-03-31 NOTE — Discharge Instructions (Signed)
You likely have multiple medical conditions causing her symptoms, including flu, otitis externa left ear, and bacterial conjunctivitis. Your flu symptoms will likely improve over the next 2-3 days. Please remember to stay well-hydrated and get plenty of rest. Please also note consider staying active and walking daily to help prevent pneumonia. Please use ibuprofen for pain and fever relief. Please use Zaditor to help take the redness had of your eyes. Please use the anabolic eyedrops treat the infection. Please use the antibiotic ear drops to treat her left ear infection.

## 2015-07-10 LAB — HM COLONOSCOPY

## 2015-10-11 ENCOUNTER — Other Ambulatory Visit: Payer: Self-pay | Admitting: Family Medicine

## 2015-10-11 ENCOUNTER — Other Ambulatory Visit: Payer: Self-pay | Admitting: Gynecology

## 2015-10-11 DIAGNOSIS — R7989 Other specified abnormal findings of blood chemistry: Secondary | ICD-10-CM

## 2015-10-11 DIAGNOSIS — R945 Abnormal results of liver function studies: Principal | ICD-10-CM

## 2015-10-18 ENCOUNTER — Ambulatory Visit
Admission: RE | Admit: 2015-10-18 | Discharge: 2015-10-18 | Disposition: A | Discharge status: Home / Self Care | Payer: BLUE CROSS/BLUE SHIELD | Source: Ambulatory Visit | Attending: Family Medicine | Admitting: Family Medicine

## 2015-10-18 DIAGNOSIS — R945 Abnormal results of liver function studies: Principal | ICD-10-CM

## 2015-10-18 DIAGNOSIS — R7989 Other specified abnormal findings of blood chemistry: Secondary | ICD-10-CM

## 2016-06-24 ENCOUNTER — Ambulatory Visit (HOSPITAL_COMMUNITY)
Admission: EM | Admit: 2016-06-24 | Discharge: 2016-06-24 | Disposition: A | Discharge status: Home / Self Care | Payer: BLUE CROSS/BLUE SHIELD | Attending: Family Medicine | Admitting: Family Medicine

## 2016-06-24 ENCOUNTER — Encounter (HOSPITAL_COMMUNITY): Payer: Self-pay | Admitting: Emergency Medicine

## 2016-06-24 DIAGNOSIS — J209 Acute bronchitis, unspecified: Secondary | ICD-10-CM

## 2016-06-24 DIAGNOSIS — R059 Cough, unspecified: Secondary | ICD-10-CM

## 2016-06-24 DIAGNOSIS — R05 Cough: Secondary | ICD-10-CM

## 2016-06-24 MED ORDER — AZITHROMYCIN 250 MG PO TABS: 250.0000 mg | ORAL_TABLET | Freq: Every day | ORAL | 0 refills | Status: DC

## 2016-06-24 MED ORDER — BENZONATATE 100 MG PO CAPS: 200.0000 mg | ORAL_CAPSULE | Freq: Three times a day (TID) | ORAL | 0 refills | Status: DC | PRN

## 2016-06-24 MED ORDER — METHYLPREDNISOLONE 4 MG PO TBPK: ORAL_TABLET | ORAL | 0 refills | Status: DC

## 2016-06-24 MED ORDER — IPRATROPIUM BROMIDE 0.06 % NA SOLN
2.0000 | Freq: Four times a day (QID) | NASAL | 0 refills | Status: AC
Start: 2016-06-24 — End: ?

## 2016-06-24 NOTE — ED Provider Notes (Signed)
CSN: [id number]     Arrival date & time 06/24/16  1835 History   None    Chief Complaint  Patient presents with  . Cough   (Consider location/radiation/quality/duration/timing/severity/associated sxs/prior Treatment) C/o cough and wheezing for 3 weeks.  C/o uri sx's for 3 weeks.   The history is provided by the patient.  Cough  Cough characteristics:  Productive Sputum characteristics:  White Severity:  Moderate Onset quality:  Sudden Duration:  3 weeks Timing:  Constant Chronicity:  New Relieved by:  Nothing Worsened by:  Nothing Ineffective treatments:  None tried Associated symptoms: rhinorrhea and sore throat     Past Medical History:  Diagnosis Date  . Anemia   . Diabetes mellitus without complication (HCC)   . Family history of breast cancer   . Family history of ovarian cancer   . Goiter   . Headache   . Hypertension   . Shortness of breath    with exercise - no meds- r/t weight per pt.  . Vision abnormalities    Past Surgical History:  Procedure Laterality Date  . bilateral macromastia  09/2005   reduction  . BREAST REDUCTION SURGERY    . CESAREAN SECTION     x 3  . CHOLECYSTECTOMY    . THYROID LOBECTOMY  03/2001    left side  . TUBAL LIGATION     Family History  Problem Relation Age of Onset  . Breast cancer Mother 68  . Hypertension Father   . CAD Father   . Breast cancer Sister 63  . Hypertension Brother   . Ovarian cancer Maternal Aunt   . Stomach cancer Maternal Grandmother        unsure if this is the actual cancer  . Hypertension Brother   . Hypertension Brother   . Ovarian cancer Cousin        maternal first cousin dx in her 63s   Social History  Substance Use Topics  . Smoking status: Never Smoker  . Smokeless tobacco: Never Used  . Alcohol use Yes     Comment: occasion   OB History    No data available     Review of Systems  Constitutional: Positive for fatigue.  HENT: Positive for postnasal drip, rhinorrhea and sore  throat.   Eyes: Negative.   Respiratory: Positive for cough.   Cardiovascular: Negative.   Gastrointestinal: Negative.   Endocrine: Negative.   Genitourinary: Negative.   Musculoskeletal: Negative.   Allergic/Immunologic: Negative.   Neurological: Negative.   Hematological: Negative.   Psychiatric/Behavioral: Negative.     Allergies  Shrimp [shellfish allergy]  Home Medications   Prior to Admission medications   Medication Sig Start Date End Date Taking? Authorizing Provider  spironolactone-hydrochlorothiazide (ALDACTAZIDE) 25-25 MG per tablet Take 1 tablet by mouth daily.   Yes [provider]  verapamil (CALAN-SR) 240 MG CR tablet Take 240 mg by mouth daily.     Yes [provider]  ACCU-CHEK SOFTCLIX LANCETS lancets  03/21/14   [provider]  acetaZOLAMIDE (DIAMOX) 125 MG tablet TAKE 1 TABLET (125 MG TOTAL) BY MOUTH 4 (FOUR) TIMES DAILY - AFTER MEALS AND AT BEDTIME. 03/02/15   Sater, Pearletha Furl, MD  azithromycin (ZITHROMAX) 250 MG tablet Take 1 tablet (250 mg total) by mouth daily. Take first 2 tablets together, then 1 every day until finished. 06/24/16   Deatra Canter, FNP  benzonatate (TESSALON) 100 MG capsule Take 2 capsules (200 mg total) by mouth 3 (three) times daily  as needed for cough. 06/24/16   Deatra Canterxford, William J, FNP  gabapentin (NEURONTIN) 300 MG capsule TAKE ONE CAPSULE BY MOUTH AT BEDTIME THEN INCREASE TO 1 CAPSULE 3 TIMES DAILY AS TOLERATED Patient not taking: Reported on 06/21/2014 07/12/13   Penumalli, Glenford BayleyVikram R, MD  gabapentin (NEURONTIN) 300 MG capsule Take 1 capsule (300 mg total) by mouth 3 (three) times daily. 06/21/14   Sater, Pearletha Furlichard A, MD  ibuprofen (ADVIL,MOTRIN) 600 MG tablet Take 1 tablet (600 mg total) by mouth every 6 (six) hours as needed. 03/31/15   Ozella RocksMerrell, David J, MD  ipratropium (ATROVENT) 0.06 % nasal spray Place 2 sprays into both nostrils 4 (four) times daily. 06/24/16   Deatra Canterxford, William J, FNP  methylPREDNISolone (MEDROL  DOSEPAK) 4 MG TBPK tablet Take 6-5-05-08-19 06/24/16   Deatra Canterxford, William J, FNP  neomycin-polymyxin-hydrocortisone (CORTISPORIN) otic solution 3-4 Drops, 4 times per day for 7 days 03/31/15   Ozella RocksMerrell, David J, MD  potassium chloride SA (K-DUR,KLOR-CON) 20 MEQ tablet Take 20 mEq by mouth daily.    [provider]  simvastatin (ZOCOR) 20 MG tablet Take 20 mg by mouth every evening. 05/23/14   [provider]  trimethoprim-polymyxin b (POLYTRIM) ophthalmic solution Place 2 drops into the left eye every 6 (six) hours. Treat for 5-7 days 03/31/15   Ozella RocksMerrell, David J, MD   Meds Ordered and Administered this Visit  Medications - No data to display  BP (!) 138/91 (BP Location: Right Arm) Comment (BP Location): regular cuff on forearm  Pulse 81   Temp 98.7 F (37.1 C) (Oral)   Resp (!) 22   SpO2 99%  No data found.   Physical Exam  Constitutional: She appears well-developed and well-nourished.  HENT:  Head: Normocephalic and atraumatic.  Right Ear: External ear normal.  Left Ear: External ear normal.  Mouth/Throat: Oropharynx is clear and moist.  Eyes: Conjunctivae and EOM are normal. Pupils are equal, round, and reactive to light.  Neck: Normal range of motion. Neck supple.  Cardiovascular: Normal rate, regular rhythm and normal heart sounds.   Pulmonary/Chest: Effort normal and breath sounds normal.  Abdominal: Soft.  Nursing note and vitals reviewed.   Urgent Care Course     Procedures (including critical care time)  Labs Review Labs Reviewed - No data to display  Imaging Review No results found.   Visual Acuity Review  Right Eye Distance:   Left Eye Distance:   Bilateral Distance:    Right Eye Near:   Left Eye Near:    Bilateral Near:         MDM   1. Acute bronchitis, unspecified organism   2. Cough    Push po fluids, rest, tylenol and motrin otc prn as directed for fever, arthralgias, and myalgias.  Follow up prn if sx's continue or  persist.  zpak Medrol dose pack atrovent nasal spray Tessalon perles      Deatra CanterOxford, William J, [place]FNP 06/24/16 1952

## 2016-06-24 NOTE — ED Triage Notes (Signed)
Cough for 3 weeks.  Minimal production with cough.

## 2016-09-30 ENCOUNTER — Ambulatory Visit (HOSPITAL_COMMUNITY)
Admission: EM | Admit: 2016-09-30 | Discharge: 2016-09-30 | Disposition: A | Discharge status: Home / Self Care | Payer: BLUE CROSS/BLUE SHIELD

## 2016-09-30 ENCOUNTER — Encounter (HOSPITAL_COMMUNITY): Payer: Self-pay | Admitting: Emergency Medicine

## 2016-09-30 DIAGNOSIS — S40021A Contusion of right upper arm, initial encounter: Secondary | ICD-10-CM | POA: Diagnosis not present

## 2016-09-30 NOTE — ED Provider Notes (Signed)
[facility]    CSN: [id number] Arrival date & time: 09/30/16  1933     History   Chief Complaint Chief Complaint  Patient presents with  . Skin Problem    HPI Crystal Mercado is a 51 y.o. female.   51 year old female that takes aspirin daily noticed a bruise to the right upper arm about 4 days ago. She states it is bigger than it was on the first day. She does not know how it got there. She does not recall any type of injury. She states it is mildly tender but otherwise not bothersome. No other bruises noted      Past Medical History:  Diagnosis Date  . Anemia   . Diabetes mellitus without complication (HCC)   . Family history of breast cancer   . Family history of ovarian cancer   . Goiter   . Headache   . Hypertension   . Shortness of breath    with exercise - no meds- r/t weight per pt.  . Vision abnormalities     Patient Active Problem List   Diagnosis Date Noted  . Pseudotumor cerebri 06/21/2014  . Headache 06/21/2014  . Vertigo 06/21/2014  . Visual disturbance 06/21/2014  . Papilledema 06/21/2014  . Genetic testing 03/14/2014  . Family history of breast cancer   . Family history of ovarian cancer     Past Surgical History:  Procedure Laterality Date  . bilateral macromastia  09/2005   reduction  . BREAST REDUCTION SURGERY    . CESAREAN SECTION     x 3  . CHOLECYSTECTOMY    . THYROID LOBECTOMY  03/2001    left side  . TUBAL LIGATION      OB History    No data available       Home Medications    Prior to Admission medications   Medication Sig Start Date End Date Taking? Authorizing Provider  ACCU-CHEK SOFTCLIX LANCETS lancets  03/21/14   [provider]  acetaZOLAMIDE (DIAMOX) 125 MG tablet TAKE 1 TABLET (125 MG TOTAL) BY MOUTH 4 (FOUR) TIMES DAILY - AFTER MEALS AND AT BEDTIME. 03/02/15   Sater, Pearletha Furl, MD  azithromycin (ZITHROMAX) 250 MG tablet Take 1 tablet (250 mg total) by mouth daily. Take first 2 tablets  together, then 1 every day until finished. 06/24/16   [place], Anselm Pancoast, FNP  benzonatate (TESSALON) 100 MG capsule Take 2 capsules (200 mg total) by mouth 3 (three) times daily as needed for cough. 06/24/16   Deatra Canter, FNP  ibuprofen (ADVIL,MOTRIN) 600 MG tablet Take 1 tablet (600 mg total) by mouth every 6 (six) hours as needed. 03/31/15   Ozella Rocks, MD  ipratropium (ATROVENT) 0.06 % nasal spray Place 2 sprays into both nostrils 4 (four) times daily. 06/24/16   Deatra Canter, FNP  methylPREDNISolone (MEDROL DOSEPAK) 4 MG TBPK tablet Take 6-5-05-08-19 06/24/16   Deatra Canter, FNP  neomycin-polymyxin-hydrocortisone (CORTISPORIN) otic solution 3-4 Drops, 4 times per day for 7 days 03/31/15   Ozella Rocks, MD  potassium chloride SA (K-DUR,KLOR-CON) 20 MEQ tablet Take 20 mEq by mouth daily.    [provider]  simvastatin (ZOCOR) 20 MG tablet Take 20 mg by mouth every evening. 05/23/14   [provider]  spironolactone-hydrochlorothiazide (ALDACTAZIDE) 25-25 MG per tablet Take 1 tablet by mouth daily.    [provider]  trimethoprim-polymyxin b (POLYTRIM) ophthalmic solution Place 2 drops into the left eye every 6 (six)  hours. Treat for 5-7 days 03/31/15   Ozella Rocks, MD  verapamil (CALAN-SR) 240 MG CR tablet Take 240 mg by mouth daily.      [provider]    Family History Family History  Problem Relation Age of Onset  . Breast cancer Mother 85  . Hypertension Father   . CAD Father   . Breast cancer Sister 62  . Hypertension Brother   . Ovarian cancer Maternal Aunt   . Stomach cancer Maternal Grandmother        unsure if this is the actual cancer  . Hypertension Brother   . Hypertension Brother   . Ovarian cancer Cousin        maternal first cousin dx in her 44s    Social History Social History  Substance Use Topics  . Smoking status: Never Smoker  . Smokeless tobacco: Never Used  . Alcohol use Yes     Comment: occasion       Allergies   Shrimp [shellfish allergy]   Review of Systems Review of Systems  Constitutional: Negative.   Respiratory: Negative.   Cardiovascular: Negative.   Gastrointestinal: Negative.   Skin:       As per history of present illness  Neurological: Negative.   All other systems reviewed and are negative.    Physical Exam Triage Vital Signs ED Triage Vitals [09/30/16 2023]  Enc Vitals Group     BP 140/89     Pulse Rate 75     Resp 20     Temp 98.1 F (36.7 C)     Temp Source Oral     SpO2 100 %     Weight      Height      Head Circumference      Peak Flow      Pain Score      Pain Loc      Pain Edu?      Excl. in GC?    No data found.   Updated Vital Signs BP 140/89 (BP Location: Left Arm)   Pulse 75   Temp 98.1 F (36.7 C) (Oral)   Resp 20   SpO2 100%   Visual Acuity Right Eye Distance:   Left Eye Distance:   Bilateral Distance:    Right Eye Near:   Left Eye Near:    Bilateral Near:     Physical Exam  Constitutional: She is oriented to person, place, and time. She appears well-developed and well-nourished. No distress.  Eyes: EOM are normal.  Neck: Neck supple.  Cardiovascular: Normal rate.   Pulmonary/Chest: Effort normal. No respiratory distress.  Musculoskeletal: She exhibits no edema.  No swelling to the right upper arm. No edema. There is a 3 cm diameter annular flat ecchymotic bruise. No other lesions.  Neurological: She is alert and oriented to person, place, and time. She exhibits normal muscle tone.  Skin: Skin is warm and dry.  Psychiatric: She has a normal mood and affect.  Nursing note and vitals reviewed.    UC Treatments / Results  Labs (all labs ordered are listed, but only abnormal results are displayed) Labs Reviewed - No data to display  EKG  EKG Interpretation None       Radiology No results found.  Procedures Procedures (including critical care time)  Medications Ordered in UC Medications - No data  to display   Initial Impression / Assessment and Plan / UC Course  I have reviewed the triage vital signs  and the nursing notes.  Pertinent labs & imaging results that were available during my care of the patient were reviewed by me and considered in my medical decision making (see chart for details).   reassurance.  The bruises is of no medical concern. He will go away within the next couple weeks. You will more likely have bruises if you are on the blood tender like aspirin. This will not form a blood clot such as a DVT or PE.   Final Clinical Impressions(s) / UC Diagnoses   Final diagnoses:  Arm bruise, right, initial encounter    New Prescriptions Current Discharge Medication List       Controlled Substance Prescriptions [place] Controlled Substance Registry consulted? Not Applicable   Hayden Rasmussen, NP 09/30/16 2102

## 2016-09-30 NOTE — Discharge Instructions (Signed)
The bruises is of no medical concern. He will go away within the next couple weeks. You will more likely have bruises if you are on the blood tender like aspirin. This will not form a blood clot such as a DVT or PE.

## 2016-09-30 NOTE — ED Triage Notes (Signed)
Bruise to right upper arm seen on Thursday.  Patient does not know what happened and is concerned.

## 2017-04-17 ENCOUNTER — Encounter: Payer: Self-pay | Admitting: Obstetrics and Gynecology

## 2017-06-09 ENCOUNTER — Other Ambulatory Visit: Payer: Self-pay | Admitting: Obstetrics and Gynecology

## 2017-06-09 ENCOUNTER — Other Ambulatory Visit (HOSPITAL_COMMUNITY)
Admission: RE | Admit: 2017-06-09 | Discharge: 2017-06-09 | Disposition: A | Discharge status: Home / Self Care | Payer: BLUE CROSS/BLUE SHIELD | Source: Ambulatory Visit | Attending: Obstetrics and Gynecology | Admitting: Obstetrics and Gynecology

## 2017-06-09 DIAGNOSIS — Z01411 Encounter for gynecological examination (general) (routine) with abnormal findings: Secondary | ICD-10-CM | POA: Diagnosis present

## 2017-06-11 LAB — CYTOLOGY - PAP
Diagnosis: NEGATIVE
HPV: NOT DETECTED

## 2017-09-10 ENCOUNTER — Encounter: Payer: BLUE CROSS/BLUE SHIELD | Attending: Obstetrics and Gynecology | Admitting: *Deleted

## 2017-09-10 DIAGNOSIS — E119 Type 2 diabetes mellitus without complications: Secondary | ICD-10-CM

## 2017-09-10 DIAGNOSIS — Z713 Dietary counseling and surveillance: Secondary | ICD-10-CM | POA: Insufficient documentation

## 2017-09-10 NOTE — Patient Instructions (Addendum)
Plan:  Aim for 2 Carb Choices per meal (30 grams) +/- 1 either way  Aim for 0-2 Carbs per snack if hungry  Include protein in moderation with your meals and snacks Consider reading food labels for Total Carbohydrate of foods Consider  increasing your activity level by Arm Chair Exercises for 10 minutes 1-2 times daily, water exercises or Zumba as tolerated Continue checking BG at alternate times per day  Continue taking medication as directed by MD

## 2017-09-10 NOTE — Progress Notes (Signed)
Diabetes Self-Management Education  Visit Type: First/Initial  Appt. Start Time: 0945 Appt. End Time: 1110  09/10/2017  Ms. Crystal Mercado, identified by name and date of birth, is a 52 y.o. female with a diagnosis of Diabetes: Type 2. 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 She attended [employer] Classes when she was first diagnosed and had her own business, so she was able to exercise 3-4 days a week and had time to cook her own meals. Recently her A1c has risen and she is taking the normal dose of Metformin. She states she would like a meal plan and specific directions of how to eat that she can implement into her work schedule.   ASSESSMENT  Height 5\' 2"  (1.575 m), weight 221 lb 8 oz (100.5 kg). Body mass index is 40.51 kg/m.  Diabetes Self-Management Education - 09/10/17 0950      Visit Information   Visit Type  First/Initial      Initial Visit   Diabetes Type  Type 2    Are you currently following a meal plan?  No    Are you taking your medications as prescribed?  Yes    Date Diagnosed  2015      Health Coping   How would you rate your overall health?  Fair      Psychosocial Assessment   Patient Belief/Attitude about Diabetes  Afraid denial    Self-care barriers  Other (comment) works 12 hour days 5 days a week    Self-management support  Family    Other persons present  Patient    Patient Concerns  Nutrition/Meal planning;Weight Control;Glycemic Control    Special Needs  None    Learning Readiness  Ready    How often do you need to have someone help you when you read instructions, pamphlets, or other written materials from your doctor or pharmacy?  1 - Never    What is the last grade level you completed in school?  4 years college      Pre-Education Assessment   Patient understands the diabetes disease and treatment process.  Needs Review    Patient understands incorporating nutritional management  into lifestyle.  Needs Review    Patient undertands incorporating physical activity into lifestyle.  Needs Instruction    Patient understands using medications safely.  Needs Review    Patient understands monitoring blood glucose, interpreting and using results  Needs Review    Patient understands prevention, detection, and treatment of chronic complications.  Needs Review    Patient understands how to develop strategies to address psychosocial issues.  Needs Review    Patient understands how to develop strategies to promote health/change behavior.  Needs Review      Complications   Last HgB A1C per patient/outside source  7.4 %    How often do you check your blood sugar?  1-2 times/day    Fasting Blood glucose range (mg/dL)  [id number]    Postprandial Blood glucose range (mg/dL)  [phone]    Number of hypoglycemic episodes per month  0    Have you had a dilated eye exam in the past 12 months?  Yes    Have you had a dental exam in the past 12 months?  No    Are you checking your feet?  Yes    How many days per week are you checking your feet?  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    Physical activity and exercise   Role of exercise on diabetes management, blood pressure control and cardiac health.;Helped patient identify appropriate exercises in relation to his/her diabetes, diabetes complications and other health issue.    Medications  Reviewed patients medication for diabetes, action, purpose, timing of dose and side effects.    Monitoring  Purpose and frequency of SMBG.;Identified appropriate SMBG and/or A1C goals.      Individualized Goals (developed by patient)   Nutrition  Follow meal plan discussed    Physical Activity  Exercise 3-5 times per week    Medications  take my medication as prescribed    Monitoring   test blood glucose pre and post meals as discussed      Post-Education Assessment   Patient understands the diabetes disease and treatment process.  Demonstrates understanding / competency    Patient understands incorporating nutritional management into lifestyle.  Demonstrates understanding / competency    Patient undertands incorporating physical activity into lifestyle.  Demonstrates understanding / competency    Patient understands using medications safely.  Demonstrates understanding / competency    Patient understands monitoring blood glucose, interpreting and using results  Demonstrates understanding / competency    Patient understands prevention, detection, and treatment of chronic complications.  Demonstrates understanding / competency    Patient understands how to develop strategies to address psychosocial issues.  Demonstrates understanding / competency    Patient understands how to develop strategies to promote health/change behavior.  Demonstrates understanding / competency      Outcomes   Expected Outcomes  Demonstrated interest in learning. Expect positive outcomes     Future DMSE  PRN    Program Status  Completed       Individualized Plan for Diabetes Self-Management Training:   Learning Objective:  Patient will have a greater understanding of diabetes self-management. Patient education plan is to attend individual and/or group sessions per assessed needs and concerns.   Plan:   Patient Instructions  Plan:  Aim for 2 Carb Choices per meal (30 grams) +/- 1 either way  Aim for 0-2 Carbs per snack if hungry  Include protein in moderation with your meals and snacks Consider reading food labels for Total Carbohydrate of foods Consider  increasing your activity level by Arm Chair Exercises for 10 minutes 1-2  times daily, water exercises or Zumba as tolerated Continue checking BG at alternate times per day  Continue taking medication as directed by MD  Expected Outcomes:  Demonstrated interest in learning. Expect positive outcomes  Education material provided: ADA Diabetes: Your Take Control Guide, Food label handouts, Meal plan card and Carbohydrate counting sheet  If problems or questions, patient to contact team via:  Phone  Future DSME appointment: PRN

## 2017-11-20 ENCOUNTER — Other Ambulatory Visit: Payer: Self-pay | Admitting: Family Medicine

## 2017-11-20 DIAGNOSIS — Z1231 Encounter for screening mammogram for malignant neoplasm of breast: Secondary | ICD-10-CM

## 2017-12-16 ENCOUNTER — Other Ambulatory Visit: Payer: Self-pay | Admitting: Obstetrics and Gynecology

## 2017-12-16 DIAGNOSIS — N63 Unspecified lump in unspecified breast: Secondary | ICD-10-CM

## 2017-12-23 ENCOUNTER — Ambulatory Visit
Admission: RE | Admit: 2017-12-23 | Discharge: 2017-12-23 | Disposition: A | Discharge status: Home / Self Care | Payer: BLUE CROSS/BLUE SHIELD | Source: Ambulatory Visit | Attending: Obstetrics and Gynecology | Admitting: Obstetrics and Gynecology

## 2017-12-23 DIAGNOSIS — N63 Unspecified lump in unspecified breast: Secondary | ICD-10-CM

## 2018-09-09 ENCOUNTER — Other Ambulatory Visit: Payer: Self-pay

## 2018-09-09 DIAGNOSIS — Z20822 Contact with and (suspected) exposure to covid-19: Secondary | ICD-10-CM

## 2018-09-10 LAB — NOVEL CORONAVIRUS, NAA: SARS-CoV-2, NAA: NOT DETECTED

## 2018-09-14 ENCOUNTER — Telehealth: Payer: Self-pay | Admitting: Family Medicine

## 2018-09-14 NOTE — Telephone Encounter (Signed)
Pt aware covid lab test negative, not detected °

## 2018-10-28 ENCOUNTER — Encounter: Payer: Self-pay | Admitting: Gynecology

## 2019-02-05 DIAGNOSIS — G932 Benign intracranial hypertension: Secondary | ICD-10-CM

## 2019-02-05 HISTORY — DX: Benign intracranial hypertension: G93.2

## 2019-02-18 ENCOUNTER — Ambulatory Visit (INDEPENDENT_AMBULATORY_CARE_PROVIDER_SITE_OTHER): Payer: BLUE CROSS/BLUE SHIELD | Admitting: Neurology

## 2019-02-18 ENCOUNTER — Encounter: Payer: Self-pay | Admitting: Neurology

## 2019-02-18 ENCOUNTER — Other Ambulatory Visit: Payer: Self-pay

## 2019-02-18 VITALS — BP 110/70 | HR 89 | Temp 97.2°F | Ht 62.0 in | Wt 217.5 lb

## 2019-02-18 DIAGNOSIS — H4710 Unspecified papilledema: Secondary | ICD-10-CM | POA: Diagnosis not present

## 2019-02-18 DIAGNOSIS — G932 Benign intracranial hypertension: Secondary | ICD-10-CM | POA: Diagnosis not present

## 2019-02-18 DIAGNOSIS — R42 Dizziness and giddiness: Secondary | ICD-10-CM | POA: Diagnosis not present

## 2019-02-18 DIAGNOSIS — G4489 Other headache syndrome: Secondary | ICD-10-CM

## 2019-02-18 NOTE — Progress Notes (Signed)
[facility]  PATIENT: Crystal Mercado DOB: 01-Jan-1966  REFERRING DOCTOR OR PCP: Maurice Small, MD SOURCE: Patient, notes from Dr. Valentina Lucks, imaging reports from 2016(LP and CT), results from CSF  _________________________________   HISTORICAL  CHIEF COMPLAINT:  Chief Complaint  Patient presents with  . New Patient (Initial Visit)    Rm 13, alone. Pt reports she feels off balance sometimes when she bends over. Has a nagging headache, neck pain intermittently. Has pulsation in right ear. Has "screeching" in left ear. Sx started right before Christmas.     HISTORY OF PRESENT ILLNESS:  I had the pleasure seeing your patient, Crystal Mercado, at [facility] for neurologic consultation regarding her reduced balance, sensation, sounds in her ear, visual changes and other symptoms.  She is a 54 year old woman who began to experience lightheadedness, pulsing in the right ear and screeching sounds in the left ear.    Additionally, she feels off balanced and being pushed to one side or the other.     The lightheadedness comes and goes but is most likely to occur when she stands up and walks.   The ear symptoms occur mostly while she is laying down in bed and there is no noise.    She has had some sparkling lights in her vision just twice associated with dizziness  And lasting a few minutes (once while sitting and once while standing).     She feels her hearing is worse on the left since symptoms began.   She has no nausea.   She has a nagging headache in her forehead and notes mild photophobia and phonophobia.     She has not had similar symptoms in the past.   She denies numbness or weakness or ataxia in her limbs.    She denies bowel or bladder issues.   She denies any visual symptoms (besides the two episodes above).     She saw Dr. Nile Riggs, ophthalmology, in October 2020 and fundoscopic exam was normal.     I saw her in 2016 for pseudotumor cerebri and  papilledema.   CSF opening pressure was mildly elevated at 260 mm and symptoms improved after the LP.    She was placed on acetazolamide and is currently on 500 mg ER daily.    Before that visit, many years earlier, she also had an LP with improvement of symptoms.     REVIEW OF SYSTEMS: Constitutional: No fevers, chills, sweats, or change in appetite Eyes: No visual changes, double vision, eye pain Ear, nose and throat: No hearing loss, ear pain, nasal congestion, sore throat Cardiovascular: No chest pain, palpitations Respiratory: No shortness of breath at rest or with exertion.   No wheezes GastrointestinaI: No nausea, vomiting, diarrhea, abdominal pain, fecal incontinence Genitourinary: No dysuria, urinary retention or frequency.  No nocturia. Musculoskeletal: No neck pain, back pain Integumentary: No rash, pruritus, skin lesions Neurological: as above Psychiatric: No depression at this time.  No anxiety Endocrine: No palpitations, diaphoresis, change in appetite, change in weigh or increased thirst Hematologic/Lymphatic: No anemia, purpura, petechiae. Allergic/Immunologic: No itchy/runny eyes, nasal congestion, recent allergic reactions, rashes  ALLERGIES: Allergies  Allergen Reactions  . Shrimp [Shellfish Allergy] Anaphylaxis    HOME MEDICATIONS:  Current Outpatient Medications:  .  ACCU-CHEK SOFTCLIX LANCETS lancets, , Disp: , Rfl: 4 .  acetaZOLAMIDE (DIAMOX) 500 MG capsule, Take 500 mg by mouth 2 (two) times daily., Disp: , Rfl:  .  ASPIRIN 81 PO, Take 1 tablet  by mouth daily., Disp: , Rfl:  .  hydrOXYzine (ATARAX/VISTARIL) 25 MG tablet, Take 25 mg by mouth 3 (three) times daily as needed., Disp: , Rfl:  .  ipratropium (ATROVENT) 0.06 % nasal spray, Place 2 sprays into both nostrils 4 (four) times daily., Disp: 15 mL, Rfl: 0 .  lisinopril-hydrochlorothiazide (ZESTORETIC) 20-12.5 MG tablet, Take 1 tablet by mouth daily., Disp: , Rfl:  .  metFORMIN (GLUCOPHAGE-XR) 500 MG 24  hr tablet, Take 500 mg by mouth 2 (two) times daily with a meal. 2 with evening meal , Disp: , Rfl:   PAST MEDICAL HISTORY: Past Medical History:  Diagnosis Date  . Anemia   . Diabetes mellitus without complication (HCC)   . Family history of breast cancer   . Family history of ovarian cancer   . Goiter   . Headache   . Hypertension   . Shortness of breath    with exercise - no meds- r/t weight per pt.  . Vision abnormalities     PAST SURGICAL HISTORY: Past Surgical History:  Procedure Laterality Date  . bilateral macromastia  09/2005   reduction  . BREAST REDUCTION SURGERY    . CESAREAN SECTION     x 3  . CHOLECYSTECTOMY    . REDUCTION MAMMAPLASTY Bilateral 2007  . THYROID LOBECTOMY  03/2001    left side  . TUBAL LIGATION      FAMILY HISTORY: Family History  Problem Relation Age of Onset  . Breast cancer Mother 75  . Hypertension Father   . CAD Father   . Breast cancer Sister 20  . Hypertension Brother   . Ovarian cancer Maternal Aunt   . Stomach cancer Maternal Grandmother        unsure if this is the actual cancer  . Hypertension Brother   . Hypertension Brother   . Ovarian cancer Cousin        maternal first cousin dx in her 10s    SOCIAL HISTORY:  Social History   Socioeconomic History  . Marital status: Married    Spouse name: Crystal Mercado  . Number of children: 3  . Years of education: Not on file  . Highest education level: Not on file  Occupational History  . Not on file  Tobacco Use  . Smoking status: Never Smoker  . Smokeless tobacco: Never Used  Substance and Sexual Activity  . Alcohol use: Yes    Comment: very little  . Drug use: No  . Sexual activity: Yes    Birth control/protection: Surgical  Other Topics Concern  . Not on file  Social History Narrative   Right handed    Caffeine use: Coffee once per day or less   Lives with husband and daughter and son   Social Determinants of Health   Financial Resource Strain:   .  Difficulty of Paying Living Expenses: Not on file  Food Insecurity:   . Worried About Programme researcher, broadcasting/film/video in the Last Year: Not on file  . Ran Out of Food in the Last Year: Not on file  Transportation Needs:   . Lack of Transportation (Medical): Not on file  . Lack of Transportation (Non-Medical): Not on file  Physical Activity:   . Days of Exercise per Week: Not on file  . Minutes of Exercise per Session: Not on file  Stress:   . Feeling of Stress : Not on file  Social Connections:   . Frequency of Communication with Friends and Family: Not  on file  . Frequency of Social Gatherings with Friends and Family: Not on file  . Attends Religious Services: Not on file  . Active Member of Clubs or Organizations: Not on file  . Attends Archivist Meetings: Not on file  . Marital Status: Not on file  Intimate Partner Violence:   . Fear of Current or Ex-Partner: Not on file  . Emotionally Abused: Not on file  . Physically Abused: Not on file  . Sexually Abused: Not on file     PHYSICAL EXAM  Vitals:   02/18/19 1333  BP: 110/70  Pulse: 89  Temp: (!) 97.2 F (36.2 C)  SpO2: 97%  Weight: 217 lb 8 oz (98.7 kg)  Height: 5\' 2"  (1.575 m)    Body mass index is 39.78 kg/m.   General: The patient is well-developed and well-nourished and in no acute distress  HEENT:  Head is [place]/AT.  Sclera are anicteric.  Funduscopic exam shows mild disc edema bilaterally.  There are no venous pulsations.  Neck: No carotid bruits are noted.  The neck is nontender.  Cardiovascular: The heart has a regular rate and rhythm with a normal S1 and S2. There were no murmurs, gallops or rubs.    Skin: Extremities are without rash or  edema.  Musculoskeletal:  Back is nontender  Neurologic Exam  Mental status: The patient is alert and oriented x 3 at the time of the examination. The patient has apparent normal recent and remote memory, with an apparently normal attention span and concentration  ability.   Speech is normal.  Cranial nerves: Extraocular movements are full. Pupils are equal, round, and reactive to light and accomodation.  Visual fields are full.  Facial symmetry is present. There is good facial sensation to soft touch bilaterally.Facial strength is normal.  Trapezius and sternocleidomastoid strength is normal. No dysarthria is noted.  The tongue is midline, and the patient has symmetric elevation of the soft palate. No obvious hearing deficits are noted.  Motor:  Muscle bulk is normal.   Tone is normal. Strength is  5 / 5 in all 4 extremities.   Sensory: Sensory testing is intact to pinprick, soft touch and vibration sensation in all 4 extremities.  Coordination: Cerebellar testing reveals good finger-nose-finger and heel-to-shin bilaterally.  Gait and station: Station is normal.   Gait is normal. Tandem gait is normal. Romberg is negative.   Reflexes: Deep tendon reflexes are symmetric and normal bilaterally.   Plantar responses are flexor.    DIAGNOSTIC DATA (LABS, IMAGING, TESTING) - I reviewed patient records, labs, notes, testing and imaging myself where available.  Lab Results  Component Value Date   WBC 11.8 (H) 01/11/2013   HGB 13.9 01/11/2013   HCT 42.4 01/11/2013   MCV 80.9 01/11/2013   PLT 284 01/11/2013      Component Value Date/Time   NA 137 01/11/2013 1947   K 3.9 01/11/2013 1947   CL 97 01/11/2013 1947   CO2 25 01/11/2013 1947   GLUCOSE 102 (H) 01/11/2013 1947   BUN 16 01/11/2013 1947   CREATININE 0.95 01/11/2013 1947   CALCIUM 9.7 01/11/2013 1947   PROT 8.6 (H) 01/11/2013 1947   ALBUMIN 3.8 01/11/2013 1947   AST 23 01/11/2013 1947   ALT 23 01/11/2013 1947   ALKPHOS 105 01/11/2013 1947   BILITOT 0.9 01/11/2013 1947   GFRNONAA 71 (L) 01/11/2013 1947   GFRAA 82 (L) 01/11/2013 1947        ASSESSMENT AND  PLAN  Pseudotumor cerebri - Plan: DG FL GUIDED LUMBAR PUNCTURE  Papilledema - Plan: DG FL GUIDED LUMBAR PUNCTURE  Other  headache syndrome - Plan: DG FL GUIDED LUMBAR PUNCTURE  Vertigo - Plan: DG FL GUIDED LUMBAR PUNCTURE   In summary, Ms. Forbess is a 54 year old woman with a history of idiopathic intracranial hypertension who presents with a several month history of dizziness upon standing, pulsating sounds in her ears, mild headache and occasional visual changes.  On examination today, she has papilledema.  Because of the papilledema and her symptoms, it appears as though her intracranial pressure has increased.  We will do a lumbar puncture to evaluate this further and to hopefully help with the symptoms.  If she does have elevated opening pressure, I will increase the acetazolamide dose.  If vision worsens she should return to ophthalmology.  She will return to see [place] in several months or sooner if there are new or worsening neurologic symptoms.  Thank you for asking me to see Ms. Mckethan.  Please let me know if I can be of further assistance with her or other patients in the future.   Zenia Guest A. Epimenio Foot, MD, [facility] 02/18/2019, 2:08 PM Certified in Neurology, Clinical Neurophysiology, Sleep Medicine and Neuroimaging  [facility] [address], [address] [place], [place] [address] [phone]

## 2019-02-25 ENCOUNTER — Other Ambulatory Visit: Payer: Self-pay

## 2019-02-25 ENCOUNTER — Telehealth: Payer: Self-pay | Admitting: *Deleted

## 2019-02-25 ENCOUNTER — Ambulatory Visit
Admission: RE | Admit: 2019-02-25 | Discharge: 2019-02-25 | Disposition: A | Discharge status: Home / Self Care | Payer: PRIVATE HEALTH INSURANCE | Source: Ambulatory Visit | Attending: Neurology | Admitting: Neurology

## 2019-02-25 VITALS — BP 128/77 | HR 62

## 2019-02-25 DIAGNOSIS — H4710 Unspecified papilledema: Secondary | ICD-10-CM

## 2019-02-25 DIAGNOSIS — R42 Dizziness and giddiness: Secondary | ICD-10-CM

## 2019-02-25 DIAGNOSIS — G4489 Other headache syndrome: Secondary | ICD-10-CM

## 2019-02-25 DIAGNOSIS — G932 Benign intracranial hypertension: Secondary | ICD-10-CM

## 2019-02-25 NOTE — Discharge Instructions (Signed)

## 2019-02-25 NOTE — Telephone Encounter (Signed)
Called and spoke with pt about results per Dr. Sater note. She verbalized understanding.  

## 2019-02-25 NOTE — Telephone Encounter (Signed)
-----   Message from Asa Lente, MD sent at 02/25/2019 11:15 AM EST ----- Lumbar puncture does show pressure is elevated again.     Continue the acetazolamide (diamox)

## 2019-03-03 ENCOUNTER — Telehealth: Payer: Self-pay | Admitting: Neurology

## 2019-03-03 MED ORDER — SUMATRIPTAN SUCCINATE 100 MG PO TABS: ORAL_TABLET | ORAL | 1 refills | Status: DC

## 2019-03-03 NOTE — Telephone Encounter (Signed)
I am glad the vision is doing better.  That implies that the pressure is in a better range.  She can take OTC NSAIDs.  Also please call in sumatriptan 100 mg.  She can take 1 and repeat later in the day.  She should take no more than 4 in a week.  #10 #1

## 2019-03-03 NOTE — Telephone Encounter (Signed)
I called pt and relayed Dr. Bonnita Hollow recommendation. She is agreeable to try this. I escribed sumatriptan to Crystal Mercado on [address] in [place], [place] as requested. Pt is going to use goodrx coupon. I instructed her to call back if she does not have improvement in sx.

## 2019-03-03 NOTE — Telephone Encounter (Signed)
Pt called stating that after her spinal tap her vision did clear up but she has had a stiff neck, headache and pulsing in her ears to the point that she could not sleep last night. Pt would like to be advised from RN or provider.

## 2019-03-03 NOTE — Telephone Encounter (Signed)
I called pt back. The past three days her sx have returned that she initially saw Dr. Epimenio Foot for. She had LP on 02/25/19. She had a slight pulsation in her ear but it became severe last night. It lasted all night and she could not sleep. She has a headache today and back of neck is hurting. Her vision has cleared up since having LP. She is concerned and wanting to know if anymore testing needs to be done. Advised I will send message to Dr. Epimenio Foot and we will call back

## 2019-03-26 LAB — CRYPTOCOCCAL AG, LTX SCR RFLX TITER
Cryptococcal Ag Screen: NOT DETECTED
MICRO NUMBER:: 10066028
SPECIMEN QUALITY:: ADEQUATE

## 2019-03-26 LAB — CSF CELL COUNT WITH DIFFERENTIAL
RBC Count, CSF: 0 cells/uL
WBC, CSF: 1 cells/uL (ref 0–5)

## 2019-03-26 LAB — CSF CULTURE W GRAM STAIN
GRAM STAIN:: NONE SEEN
MICRO NUMBER:: 10066030
Result:: NO GROWTH
SPECIMEN QUALITY:: ADEQUATE

## 2019-03-26 LAB — FUNGUS CULTURE W SMEAR
CULTURE:: NO GROWTH
MICRO NUMBER:: 10066029
SMEAR:: NONE SEEN
SPECIMEN QUALITY:: ADEQUATE

## 2019-03-26 LAB — GLUCOSE, CSF: Glucose, CSF: 96 mg/dL — ABNORMAL HIGH (ref 40–80)

## 2019-03-26 LAB — PROTEIN, CSF: Total Protein, CSF: 43 mg/dL (ref 15–45)

## 2019-03-29 ENCOUNTER — Other Ambulatory Visit: Payer: Self-pay | Admitting: Obstetrics and Gynecology

## 2019-03-29 DIAGNOSIS — Z1231 Encounter for screening mammogram for malignant neoplasm of breast: Secondary | ICD-10-CM

## 2019-04-27 ENCOUNTER — Other Ambulatory Visit: Payer: Self-pay

## 2019-04-27 ENCOUNTER — Ambulatory Visit
Admission: RE | Admit: 2019-04-27 | Discharge: 2019-04-27 | Disposition: A | Discharge status: Home / Self Care | Payer: PRIVATE HEALTH INSURANCE | Source: Ambulatory Visit | Attending: Obstetrics and Gynecology | Admitting: Obstetrics and Gynecology

## 2019-04-27 DIAGNOSIS — Z1231 Encounter for screening mammogram for malignant neoplasm of breast: Secondary | ICD-10-CM

## 2019-06-22 ENCOUNTER — Ambulatory Visit: Payer: BLUE CROSS/BLUE SHIELD | Admitting: Family Medicine

## 2019-09-28 ENCOUNTER — Other Ambulatory Visit: Payer: PRIVATE HEALTH INSURANCE

## 2019-09-28 ENCOUNTER — Other Ambulatory Visit: Payer: Self-pay

## 2019-09-28 DIAGNOSIS — Z20822 Contact with and (suspected) exposure to covid-19: Secondary | ICD-10-CM

## 2019-09-29 LAB — SARS-COV-2, NAA 2 DAY TAT

## 2019-09-29 LAB — NOVEL CORONAVIRUS, NAA: SARS-CoV-2, NAA: NOT DETECTED

## 2019-10-13 ENCOUNTER — Other Ambulatory Visit: Payer: Self-pay

## 2020-01-17 ENCOUNTER — Emergency Department (HOSPITAL_COMMUNITY): Payer: 59

## 2020-01-17 ENCOUNTER — Encounter (HOSPITAL_COMMUNITY): Payer: Self-pay

## 2020-01-17 ENCOUNTER — Emergency Department (HOSPITAL_COMMUNITY)
Admission: EM | Admit: 2020-01-17 | Discharge: 2020-01-17 | Disposition: A | Discharge status: Home / Self Care | Payer: 59 | Attending: Emergency Medicine | Admitting: Emergency Medicine

## 2020-01-17 ENCOUNTER — Other Ambulatory Visit: Payer: Self-pay

## 2020-01-17 DIAGNOSIS — E119 Type 2 diabetes mellitus without complications: Secondary | ICD-10-CM | POA: Diagnosis not present

## 2020-01-17 DIAGNOSIS — T50Z95A Adverse effect of other vaccines and biological substances, initial encounter: Secondary | ICD-10-CM | POA: Diagnosis not present

## 2020-01-17 DIAGNOSIS — T887XXA Unspecified adverse effect of drug or medicament, initial encounter: Secondary | ICD-10-CM | POA: Insufficient documentation

## 2020-01-17 DIAGNOSIS — R590 Localized enlarged lymph nodes: Secondary | ICD-10-CM | POA: Insufficient documentation

## 2020-01-17 DIAGNOSIS — I10 Essential (primary) hypertension: Secondary | ICD-10-CM | POA: Insufficient documentation

## 2020-01-17 DIAGNOSIS — R21 Rash and other nonspecific skin eruption: Secondary | ICD-10-CM

## 2020-01-17 LAB — CBC WITH DIFFERENTIAL/PLATELET
Abs Immature Granulocytes: 0.02 10*3/uL (ref 0.00–0.07)
Basophils Absolute: 0 10*3/uL (ref 0.0–0.1)
Basophils Relative: 0 %
Eosinophils Absolute: 0.1 10*3/uL (ref 0.0–0.5)
Eosinophils Relative: 1 %
HCT: 41.3 % (ref 36.0–46.0)
Hemoglobin: 12.9 g/dL (ref 12.0–15.0)
Immature Granulocytes: 0 %
Lymphocytes Relative: 20 %
Lymphs Abs: 1.8 10*3/uL (ref 0.7–4.0)
MCH: 26 pg (ref 26.0–34.0)
MCHC: 31.2 g/dL (ref 30.0–36.0)
MCV: 83.3 fL (ref 80.0–100.0)
Monocytes Absolute: 0.8 10*3/uL (ref 0.1–1.0)
Monocytes Relative: 9 %
Neutro Abs: 6 10*3/uL (ref 1.7–7.7)
Neutrophils Relative %: 70 %
Platelets: 254 10*3/uL (ref 150–400)
RBC: 4.96 MIL/uL (ref 3.87–5.11)
RDW: 15 % (ref 11.5–15.5)
WBC: 8.7 10*3/uL (ref 4.0–10.5)
nRBC: 0 % (ref 0.0–0.2)

## 2020-01-17 LAB — COMPREHENSIVE METABOLIC PANEL
ALT: 71 U/L — ABNORMAL HIGH (ref 0–44)
AST: 64 U/L — ABNORMAL HIGH (ref 15–41)
Albumin: 3.8 g/dL (ref 3.5–5.0)
Alkaline Phosphatase: 99 U/L (ref 38–126)
Anion gap: 12 (ref 5–15)
BUN: 18 mg/dL (ref 6–20)
CO2: 24 mmol/L (ref 22–32)
Calcium: 9.1 mg/dL (ref 8.9–10.3)
Chloride: 102 mmol/L (ref 98–111)
Creatinine, Ser: 1.34 mg/dL — ABNORMAL HIGH (ref 0.44–1.00)
GFR, Estimated: 47 mL/min — ABNORMAL LOW (ref >60)
Glucose, Bld: 163 mg/dL — ABNORMAL HIGH (ref 70–99)
Potassium: 3.4 mmol/L — ABNORMAL LOW (ref 3.5–5.1)
Sodium: 138 mmol/L (ref 135–145)
Total Bilirubin: 1.6 mg/dL — ABNORMAL HIGH (ref 0.3–1.2)
Total Protein: 7.9 g/dL (ref 6.5–8.1)

## 2020-01-17 NOTE — Discharge Instructions (Addendum)
You were evaluated in the Emergency Department and after careful evaluation, we did not find any emergent condition requiring admission or further testing in the hospital.  Your exam/testing today was overall reassuring.  Your symptoms seem to be related to a swollen lymph node, likely reacting in response to the recent vaccine.  This will likely improve within the next few days.  We recommend Tylenol and Motrin and cold compresses as we discussed.  Your rash may be due to dry skin, we recommend use of daily moisturizer.  Please return to the Emergency Department if you experience any worsening of your condition.  Thank you for allowing [place] to be a part of your care.

## 2020-01-17 NOTE — ED Triage Notes (Signed)
Pt presents with swelling to Left collarbone/lateral neck area after receiving the pfizer vaccine Saturday am. Pt also reports several bruised areas to her chest x2 weeks.

## 2020-01-22 NOTE — ED Provider Notes (Signed)
[facility]-EMERGENCY DEPT [facility] Emergency Department Provider Note MRN:  [id number]  Arrival date & time: 01/22/20     Chief Complaint   Medication Reaction (Post vaccine )   History of Present Illness   Crystal Mercado is a 54 y.o. year-old female with a history of diabetes presenting to the ED with chief complaint of medication reaction.  Patient had the Covid vaccine few days ago and since then has been having some soreness to the arm in which she received vaccine.  She also has a swollen nodule to her neck on the same side.  She has also noticed some rash to her chest for the past several weeks.  Denies fever, no chest pain or shortness of breath, no other complaints.  Review of Systems  A complete 10 system review of systems was obtained and all systems are negative except as noted in the HPI and PMH.   Patient's Health History    Past Medical History:  Diagnosis Date  . Anemia   . Diabetes mellitus without complication (HCC)   . Family history of breast cancer   . Family history of ovarian cancer   . Goiter   . Headache   . Hypertension   . Shortness of breath    with exercise - no meds- r/t weight per pt.  . Vision abnormalities     Past Surgical History:  Procedure Laterality Date  . bilateral macromastia  09/2005   reduction  . BREAST REDUCTION SURGERY    . CESAREAN SECTION     x 3  . CHOLECYSTECTOMY    . REDUCTION MAMMAPLASTY Bilateral 2007  . THYROID LOBECTOMY  03/2001    left side  . TUBAL LIGATION      Family History  Problem Relation Age of Onset  . Breast cancer Mother 61  . Hypertension Father   . CAD Father   . Breast cancer Sister 80  . Hypertension Brother   . Ovarian cancer Maternal Aunt   . Stomach cancer Maternal Grandmother        unsure if this is the actual cancer  . Hypertension Brother   . Hypertension Brother   . Ovarian cancer Cousin        maternal first cousin dx in her 23s    Social History   Socioeconomic History  .  Marital status: Married    Spouse name: Sophiya Morello  . Number of children: 3  . Years of education: Not on file  . Highest education level: Not on file  Occupational History  . Not on file  Tobacco Use  . Smoking status: Never Smoker  . Smokeless tobacco: Never Used  Substance and Sexual Activity  . Alcohol use: Yes    Comment: very little  . Drug use: No  . Sexual activity: Yes    Birth control/protection: Surgical  Other Topics Concern  . Not on file  Social History Narrative   Right handed    Caffeine use: Coffee once per day or less   Lives with husband and daughter and son   Social Determinants of Health   Financial Resource Strain: Not on file  Food Insecurity: Not on file  Transportation Needs: Not on file  Physical Activity: Not on file  Stress: Not on file  Social Connections: Not on file  Intimate Partner Violence: Not on file     Physical Exam   Vitals:   01/17/20 2004 01/17/20 2134  BP: 130/89 107/89  Pulse: 78 78  Resp: 18 16  Temp:  98.2 F (36.8 C)  SpO2: 99% 99%    CONSTITUTIONAL: Well-appearing, NAD NEURO:  Alert and oriented x 3, no focal deficits EYES:  eyes equal and reactive ENT/NECK: Lymphadenopathy to left anterior neck CARDIO: Regular rate, well-perfused, normal S1 and S2 PULM:  CTAB no wheezing or rhonchi GI/GU:  normal bowel sounds, non-distended, non-tender MSK/SPINE:  No gross deformities, no edema SKIN: Scattered faint macular rash to chest PSYCH:  Appropriate speech and behavior  *Additional and/or pertinent findings included in MDM below  Diagnostic and Interventional Summary    EKG Interpretation  Date/Time:    Ventricular Rate:    PR Interval:    QRS Duration:   QT Interval:    QTC Calculation:   R Axis:     Text Interpretation:        Labs Reviewed  COMPREHENSIVE METABOLIC PANEL - Abnormal; Notable for the following components:      Result Value   Potassium 3.4 (*)    Glucose, Bld 163 (*)    Creatinine,  Ser 1.34 (*)    AST 64 (*)    ALT 71 (*)    Total Bilirubin 1.6 (*)    GFR, Estimated 47 (*)    All other components within normal limits  CBC WITH DIFFERENTIAL/PLATELET    DG Chest 2 View  Final Result      Medications - No data to display   Procedures  /  Critical Care Procedures  ED Course and Medical Decision Making  I have reviewed the triage vital signs, the nursing notes, and pertinent available records from the EMR.  Listed above are laboratory and imaging tests that I personally ordered, reviewed, and interpreted and then considered in my medical decision making (see below for details).  Suspect reactive lymphadenopathy related to recent Covid vaccine.  Rash is more chronic and may be related to dry skin, otherwise normal vital signs and no evidence of emergent condition, appropriate for discharge with reassurance.       Elmer Sow. Pilar Plate, MD [facility] Emergency Medicine [facility] [email or web address]  Final Clinical Impressions(s) / ED Diagnoses     ICD-10-CM   1. Lymphadenopathy of left cervical region  R59.0   2. Rash  R21     ED Discharge Orders    None       Discharge Instructions Discussed with and Provided to Patient:     Discharge Instructions     You were evaluated in the Emergency Department and after careful evaluation, we did not find any emergent condition requiring admission or further testing in the hospital.  Your exam/testing today was overall reassuring.  Your symptoms seem to be related to a swollen lymph node, likely reacting in response to the recent vaccine.  This will likely improve within the next few days.  We recommend Tylenol and Motrin and cold compresses as we discussed.  Your rash may be due to dry skin, we recommend use of daily moisturizer.  Please return to the Emergency Department if you experience any worsening of your condition.  Thank you for allowing [place] to be a part of your care.       Sabas Sous, MD 01/22/20 [phone]

## 2020-04-24 ENCOUNTER — Encounter (HOSPITAL_COMMUNITY): Payer: Self-pay | Admitting: Emergency Medicine

## 2020-04-24 ENCOUNTER — Ambulatory Visit (HOSPITAL_COMMUNITY)
Admission: EM | Admit: 2020-04-24 | Discharge: 2020-04-24 | Disposition: A | Discharge status: Home / Self Care | Payer: 59 | Attending: Internal Medicine | Admitting: Internal Medicine

## 2020-04-24 ENCOUNTER — Other Ambulatory Visit: Payer: Self-pay

## 2020-04-24 DIAGNOSIS — R1010 Upper abdominal pain, unspecified: Secondary | ICD-10-CM

## 2020-04-24 DIAGNOSIS — B349 Viral infection, unspecified: Secondary | ICD-10-CM | POA: Insufficient documentation

## 2020-04-24 DIAGNOSIS — R110 Nausea: Secondary | ICD-10-CM | POA: Diagnosis present

## 2020-04-24 DIAGNOSIS — Z20822 Contact with and (suspected) exposure to covid-19: Secondary | ICD-10-CM | POA: Insufficient documentation

## 2020-04-24 DIAGNOSIS — R197 Diarrhea, unspecified: Secondary | ICD-10-CM | POA: Insufficient documentation

## 2020-04-24 LAB — POCT URINALYSIS DIPSTICK, ED / UC
Glucose, UA: NEGATIVE mg/dL
Hgb urine dipstick: NEGATIVE
Nitrite: NEGATIVE
Protein, ur: 30 mg/dL — AB
Specific Gravity, Urine: >=1.03 (ref 1.005–1.030)
Urobilinogen, UA: 0.2 mg/dL (ref 0.0–1.0)
pH: 5.5 (ref 5.0–8.0)

## 2020-04-24 MED ORDER — ONDANSETRON 4 MG PO TBDP: 4.0000 mg | ORAL_TABLET | Freq: Three times a day (TID) | ORAL | 0 refills | Status: AC | PRN

## 2020-04-24 NOTE — ED Provider Notes (Signed)
[facility]    CSN: [id number] Arrival date & time: 04/24/20  1601      History   Chief Complaint Chief Complaint  Patient presents with  . Abdominal Pain  . Nausea  . Diarrhea    HPI Crystal Mercado is a 55 y.o. female.   Patient presents with 5-day history of nausea, diarrhea, upper abdominal pain, back pain.  She also reports mild cough and shortness of breath.  She denies vomiting, dysuria, fever, chills, rash, or other symptoms.  No treatments attempted at home.  Her medical history includes hypertension, diabetes, pseudotumor cerebri, headache, vertigo.  The history is provided by the patient and medical records.    Past Medical History:  Diagnosis Date  . Anemia   . Diabetes mellitus without complication (HCC)   . Family history of breast cancer   . Family history of ovarian cancer   . Goiter   . Headache   . Hypertension   . Shortness of breath    with exercise - no meds- r/t weight per pt.  . Vision abnormalities     Patient Active Problem List   Diagnosis Date Noted  . Pseudotumor cerebri 06/21/2014  . Headache 06/21/2014  . Vertigo 06/21/2014  . Visual disturbance 06/21/2014  . Papilledema 06/21/2014  . Genetic testing 03/14/2014  . Family history of breast cancer   . Family history of ovarian cancer     Past Surgical History:  Procedure Laterality Date  . bilateral macromastia  09/2005   reduction  . BREAST REDUCTION SURGERY    . CESAREAN SECTION     x 3  . CHOLECYSTECTOMY    . REDUCTION MAMMAPLASTY Bilateral 2007  . THYROID LOBECTOMY  03/2001    left side  . TUBAL LIGATION      OB History   No obstetric history on file.      Home Medications    Prior to Admission medications   Medication Sig Start Date End Date Taking? Authorizing Provider  ASPIRIN 81 PO Take 1 tablet by mouth daily.   Yes [provider]  lisinopril-hydrochlorothiazide (ZESTORETIC) 20-12.5 MG tablet Take 1 tablet by mouth daily.   Yes [provider]  ondansetron (ZOFRAN ODT) 4 MG disintegrating tablet Take 1 tablet (4 mg total) by mouth every 8 (eight) hours as needed for nausea or vomiting. 04/24/20  Yes Mickie Bail, NP  ACCU-CHEK SOFTCLIX LANCETS lancets  03/21/14   [provider]  acetaZOLAMIDE (DIAMOX) 500 MG capsule Take 500 mg by mouth 2 (two) times daily.    [provider]  hydrOXYzine (ATARAX/VISTARIL) 25 MG tablet Take 25 mg by mouth 3 (three) times daily as needed.    [provider]  ipratropium (ATROVENT) 0.06 % nasal spray Place 2 sprays into both nostrils 4 (four) times daily. 06/24/16   Deatra Canter, FNP  metFORMIN (GLUCOPHAGE-XR) 500 MG 24 hr tablet Take 500 mg by mouth 2 (two) times daily with a meal. 2 with evening meal    [provider]  SUMAtriptan (IMITREX) 100 MG tablet Take 1 tab at onset of headache. May repeat in 2 hours if needed. Do not take more than 2 tablets in 24 hours or 4 tablets in a week. 03/03/19   Sater, Pearletha Furl, MD    Family History Family History  Problem Relation Age of Onset  . Breast cancer Mother 51  . Hypertension Father   . CAD Father   . Breast cancer Sister 51  .  Hypertension Brother   . Ovarian cancer Maternal Aunt   . Stomach cancer Maternal Grandmother        unsure if this is the actual cancer  . Hypertension Brother   . Hypertension Brother   . Ovarian cancer Cousin        maternal first cousin dx in her 43s    Social History Social History   Tobacco Use  . Smoking status: Never Smoker  . Smokeless tobacco: Never Used  Vaping Use  . Vaping Use: Never used  Substance Use Topics  . Alcohol use: Not Currently    Comment: very little  . Drug use: No     Allergies   Shrimp [shellfish allergy]   Review of Systems Review of Systems  Constitutional: Negative for chills and fever.  HENT: Negative for ear pain and sore throat.   Eyes: Negative for pain and visual disturbance.  Respiratory: Positive for cough and  shortness of breath.   Cardiovascular: Negative for chest pain and palpitations.  Gastrointestinal: Positive for abdominal pain, diarrhea and nausea. Negative for vomiting.  Genitourinary: Negative for dysuria, hematuria, pelvic pain and vaginal discharge.  Musculoskeletal: Positive for back pain. Negative for arthralgias.  Skin: Negative for color change and rash.  Neurological: Negative for seizures and syncope.  All other systems reviewed and are negative.    Physical Exam Triage Vital Signs ED Triage Vitals  Enc Vitals Group     BP      Pulse      Resp      Temp      Temp src      SpO2      Weight      Height      Head Circumference      Peak Flow      Pain Score      Pain Loc      Pain Edu?      Excl. in GC?    No data found.  Updated Vital Signs BP 121/82 (BP Location: Right Arm)   Pulse 100   Temp 98.8 F (37.1 C) (Oral)   Resp 20   SpO2 100%   Visual Acuity Right Eye Distance:   Left Eye Distance:   Bilateral Distance:    Right Eye Near:   Left Eye Near:    Bilateral Near:     Physical Exam Vitals and nursing note reviewed.  Constitutional:      General: She is not in acute distress.    Appearance: She is well-developed.  HENT:     Head: Normocephalic and atraumatic.     Right Ear: Tympanic membrane normal.     Left Ear: Tympanic membrane normal.     Nose: Nose normal.     Mouth/Throat:     Mouth: Mucous membranes are moist.     Pharynx: Oropharynx is clear.  Eyes:     Conjunctiva/sclera: Conjunctivae normal.  Cardiovascular:     Rate and Rhythm: Normal rate and regular rhythm.     Heart sounds: Normal heart sounds.  Pulmonary:     Effort: Pulmonary effort is normal. No respiratory distress.     Breath sounds: Normal breath sounds.  Abdominal:     General: Bowel sounds are normal.     Palpations: Abdomen is soft.     Tenderness: There is abdominal tenderness in the epigastric area. There is no right CVA tenderness, left CVA tenderness,  guarding or rebound.  Musculoskeletal:     Cervical back:  Neck supple.  Skin:    General: Skin is warm and dry.  Neurological:     General: No focal deficit present.     Mental Status: She is alert and oriented to person, place, and time.     Gait: Gait normal.  Psychiatric:        Mood and Affect: Mood normal.        Behavior: Behavior normal.      UC Treatments / Results  Labs (all labs ordered are listed, but only abnormal results are displayed) Labs Reviewed  POCT URINALYSIS DIPSTICK, ED / UC - Abnormal; Notable for the following components:      Result Value   Bilirubin Urine SMALL (*)    Ketones, ur TRACE (*)    Protein, ur 30 (*)    Leukocytes,Ua TRACE (*)    All other components within normal limits  URINE CULTURE  SARS CORONAVIRUS 2 (TAT 6-24 HRS)    EKG   Radiology No results found.  Procedures Procedures (including critical care time)  Medications Ordered in UC Medications - No data to display  Initial Impression / Assessment and Plan / UC Course  I have reviewed the triage vital signs and the nursing notes.  Pertinent labs & imaging results that were available during my care of the patient were reviewed by me and considered in my medical decision making (see chart for details).   Upper abdominal pain, nausea, diarrhea, viral illness.  Treating with Zofran.  Instructed patient to keep yourself hydrated with clear liquids.  ED precautions discussed.  PCR COVID pending.  Instructed patient to self quarantine until the test result is back.  Instructed her to follow-up with her PCP if her symptoms are not improving.  She agrees to plan of care.   Final Clinical Impressions(s) / UC Diagnoses   Final diagnoses:  Pain of upper abdomen  Nausea  Diarrhea, unspecified type  Viral illness     Discharge Instructions     Take the antinausea medication as directed.    Keep yourself hydrated with clear liquids, such as water, Gatorade, Pedialyte, Sprite,  or ginger ale.    Go to the emergency department if you have acute worsening symptoms.    Your COVID test is pending.  You should self quarantine until the test result is back.    Follow-up with your primary care provider if your symptoms are not improving.           ED Prescriptions    Medication Sig Dispense Auth. Provider   ondansetron (ZOFRAN ODT) 4 MG disintegrating tablet Take 1 tablet (4 mg total) by mouth every 8 (eight) hours as needed for nausea or vomiting. 20 tablet Mickie Bail, NP     PDMP not reviewed this encounter.   Mickie Bail, NP 04/24/20 [phone]

## 2020-04-24 NOTE — Discharge Instructions (Signed)
Take the antinausea medication as directed.    Keep yourself hydrated with clear liquids, such as water, Gatorade, Pedialyte, Sprite, or ginger ale.    Go to the emergency department if you have acute worsening symptoms.    Your COVID test is pending.  You should self quarantine until the test result is back.    Follow-up with your primary care provider if your symptoms are not improving.

## 2020-04-24 NOTE — ED Triage Notes (Signed)
Pt presents today diarrhea, abd pain, back pain and nausea. Denies vomiting.

## 2020-04-25 LAB — SARS CORONAVIRUS 2 (TAT 6-24 HRS): SARS Coronavirus 2: NEGATIVE

## 2020-04-25 LAB — URINE CULTURE: Culture: NO GROWTH

## 2020-06-28 ENCOUNTER — Encounter (HOSPITAL_COMMUNITY): Payer: Self-pay | Admitting: Emergency Medicine

## 2020-08-12 ENCOUNTER — Encounter (HOSPITAL_COMMUNITY): Payer: Self-pay

## 2020-08-12 ENCOUNTER — Other Ambulatory Visit: Payer: Self-pay

## 2020-08-12 ENCOUNTER — Ambulatory Visit (HOSPITAL_COMMUNITY)
Admission: EM | Admit: 2020-08-12 | Discharge: 2020-08-12 | Disposition: A | Discharge status: Home / Self Care | Payer: 59 | Attending: Urgent Care | Admitting: Urgent Care

## 2020-08-12 DIAGNOSIS — M79674 Pain in right toe(s): Secondary | ICD-10-CM | POA: Diagnosis not present

## 2020-08-12 DIAGNOSIS — E119 Type 2 diabetes mellitus without complications: Secondary | ICD-10-CM

## 2020-08-12 DIAGNOSIS — Z7984 Long term (current) use of oral hypoglycemic drugs: Secondary | ICD-10-CM | POA: Diagnosis not present

## 2020-08-12 DIAGNOSIS — E118 Type 2 diabetes mellitus with unspecified complications: Secondary | ICD-10-CM

## 2020-08-12 MED ORDER — COLCHICINE 0.6 MG PO TABS: ORAL_TABLET | ORAL | 0 refills | Status: DC

## 2020-08-12 NOTE — ED Provider Notes (Addendum)
[facility]   MRN: [id number] DOB: Jul 20, 1965  Subjective:   Crystal Mercado is a 55 y.o. female presenting for 2-day history of acute onset left-sided great toe pain at the base of her toe with swelling.  Denies fall, trauma, open wound.  She has not had gout in the past.  Has been eating a lot of fish lately, drink a lot of alcohol over the weekend.  She has a type II diabetic controlled with metformin, not insulin.  No current facility-administered medications for this encounter.  Current Outpatient Medications:    colchicine 0.6 MG tablet, At the start of a gout flare, take 2 tablets. After an hour, take 1 more tablet. Do no repeat dosing for 48 hours., Disp: 30 tablet, Rfl: 0   ACCU-CHEK SOFTCLIX LANCETS lancets, , Disp: , Rfl: 4   acetaZOLAMIDE (DIAMOX) 500 MG capsule, Take 500 mg by mouth 2 (two) times daily., Disp: , Rfl:    ASPIRIN 81 PO, Take 1 tablet by mouth daily., Disp: , Rfl:    hydrOXYzine (ATARAX/VISTARIL) 25 MG tablet, Take 25 mg by mouth 3 (three) times daily as needed., Disp: , Rfl:    ipratropium (ATROVENT) 0.06 % nasal spray, Place 2 sprays into both nostrils 4 (four) times daily., Disp: 15 mL, Rfl: 0   lisinopril-hydrochlorothiazide (ZESTORETIC) 20-12.5 MG tablet, Take 1 tablet by mouth daily., Disp: , Rfl:    metFORMIN (GLUCOPHAGE-XR) 500 MG 24 hr tablet, Take 500 mg by mouth 2 (two) times daily with a meal. 2 with evening meal, Disp: , Rfl:    ondansetron (ZOFRAN ODT) 4 MG disintegrating tablet, Take 1 tablet (4 mg total) by mouth every 8 (eight) hours as needed for nausea or vomiting., Disp: 20 tablet, Rfl: 0   SUMAtriptan (IMITREX) 100 MG tablet, Take 1 tab at onset of headache. May repeat in 2 hours if needed. Do not take more than 2 tablets in 24 hours or 4 tablets in a week., Disp: 10 tablet, Rfl: 1   Allergies  Allergen Reactions   Shrimp [Shellfish Allergy] Anaphylaxis    Past Medical History:  Diagnosis Date   Anemia    Diabetes  mellitus without complication (HCC)    Family history of breast cancer    Family history of ovarian cancer    Goiter    Headache    Hypertension    Shortness of breath    with exercise - no meds- r/t weight per pt.   Vision abnormalities      Past Surgical History:  Procedure Laterality Date   bilateral macromastia  09/2005   reduction   BREAST REDUCTION SURGERY     CESAREAN SECTION     x 3   CHOLECYSTECTOMY     REDUCTION MAMMAPLASTY Bilateral 2007   THYROID LOBECTOMY  03/2001    left side   TUBAL LIGATION      Family History  Problem Relation Age of Onset   Breast cancer Mother 68   Hypertension Father    CAD Father    Breast cancer Sister 58   Hypertension Brother    Ovarian cancer Maternal Aunt    Stomach cancer Maternal Grandmother        unsure if this is the actual cancer   Hypertension Brother    Hypertension Brother    Ovarian cancer Cousin        maternal first cousin dx in her 66s    Social History   Tobacco Use  Smoking status: Never   Smokeless tobacco: Never  Vaping Use   Vaping Use: Never used  Substance Use Topics   Alcohol use: Not Currently    Comment: very little   Drug use: No    ROS   Objective:   Vitals: BP (!) 155/93 (BP Location: Right Arm)   Pulse 85   Temp 98.8 F (37.1 C) (Oral)   Resp 18   SpO2 98%   Physical Exam Constitutional:      General: She is not in acute distress.    Appearance: Normal appearance. She is well-developed. She is not ill-appearing, toxic-appearing or diaphoretic.  HENT:     Head: Normocephalic and atraumatic.     Nose: Nose normal.     Mouth/Throat:     Mouth: Mucous membranes are moist.     Pharynx: Oropharynx is clear.  Eyes:     General: No scleral icterus.       Right eye: No discharge.        Left eye: No discharge.     Extraocular Movements: Extraocular movements intact.     Conjunctiva/sclera: Conjunctivae normal.     Pupils: Pupils are equal, round, and reactive to light.   Cardiovascular:     Rate and Rhythm: Normal rate.  Pulmonary:     Effort: Pulmonary effort is normal.  Musculoskeletal:       Feet:  Skin:    General: Skin is warm and dry.  Neurological:     General: No focal deficit present.     Mental Status: She is alert and oriented to person, place, and time.  Psychiatric:        Mood and Affect: Mood normal.        Behavior: Behavior normal.        Thought Content: Thought content normal.        Judgment: Judgment normal.    Assessment and Plan :   PDMP not reviewed this encounter.  1. Great toe pain, right   2. Type 2 diabetes mellitus treated without insulin (HCC)     Given lack of trauma, recent dietary choices and alcohol use suspect is most likely gout.  Start colchicine, low purine diet.  Deferred imaging as there is a lack of trauma.  Labs and medical record reviewed and patient does not have a history of CKD.  Counseled patient on potential for adverse effects with medications prescribed/recommended today, ER and return-to-clinic precautions discussed, patient verbalized understanding.     Wallis Bamberg, PA-C 08/12/20 1350

## 2020-08-12 NOTE — ED Triage Notes (Signed)
Pt presents with right great toe pain & swelling since yesterday, non injury related.

## 2020-10-03 NOTE — Progress Notes (Signed)
Chief Complaint  Patient presents with   Establish Care    New patient to [facility] care. Had covid 8/21 and has had chronic cough since. Uses cough drops every day, only calms it down-does not stop the cough. Ran out of colchicine, acetazolamide, lisinopril HCT, metformin and ipratroprium nasal spray about 2 months ago-needs refills on all.    Patient presents to establish care. She had [facility] insurance 2 years ago, then switched to Dr. Laurann Montana at [facility].  No records available at time of visit from [facility]. Chart reviewed prior to visit--available info from [facility] and [facility].  DM--diagnosed about 3-4 years ago. She has only been treated with metformin, but doesn't tolerate well, causes nausea.  She has been out of medication for at least 2 months. The last prescription was for 1037m tablet, taking twice daily.  Felt more nausea with this regimen than when she took the 5046mtwice daily of the extended release.  Sugars have been upper 100's to lower 200's since she has been out of medication. Denies polydipsia or polyuria.  She used to take a medication for cholesterol.  Reports told it was good on last check, hasn't been taking meds for a while, can't recall the dose. She also thinks maybe a medication messed with her liver or kidneys, as a reason for stopping it. Need records.  HTN: she had been taking lisinopril HCTZ until she ran out about 2 months ago.  She had some leftover HCTZ 2555mhat she had been taking, last took it yesterday. She has had a cough since COVID last year--she feels that her cough got worse since she restarted HCTZ, doesn't think it was the lisinopril that caused cough. Only checks BP's through her watch--125/68 here, but much higher by nurse today, so not accurate. She had a headache in her forehead and behind eyes today, and a little dizzy. She has h/o allergies. Doesn't have headaches frequently. No chest pain, palpitations, denies edema.  Gout--This was diagnosed with a flare  in L great toe in 08/2020, seen in UC. She was treated with colchicine and recommended low purine diet. Occurred after a weekend with high alcohol intake. Colchicine helped, no further pains/flares. She believes she was taking the 25m18mTZ at that time.  Pseudotumor cerebri, under the care of Dr. SateFelecia Shellingast seen 02/2019, and LP revealed elevated opening pressure.  She is treated with Diamox. Doesn't take it every day, has run out, without recurrent symptoms. Causes her food to taste metallic. Sx in 02/2019 included lightheadedness, screeching sounds in L ear, pulsating in R ear, feeling off balanced (feeling like being pushed to one side or the other), nagging headache, mild photo and phonophobia. Also prescribed imitrex by Dr. SateFelecia Shellingt needing).  She reports this flares up about every 2 years, has had 4 episodes, the first was at age 56. 47  Past Medical History:  Diagnosis Date   Anemia    Diabetes mellitus without complication (HCC)[place] Family history of breast cancer    Family history of ovarian cancer    Goiter    Headache    Hypertension    Pseudotumor cerebri 02/2019   Shortness of breath    with exercise - no meds- r/t weight per pt.   Vision abnormalities    Past Surgical History:  Procedure Laterality Date   bilateral macromastia  09/2005   reduction   BREAST REDUCTION SURGERY     CESAREAN SECTION     x  3   CHOLECYSTECTOMY     REDUCTION MAMMAPLASTY Bilateral 2007   THYROID LOBECTOMY  03/2001    left side   TUBAL LIGATION     Social History   Social History Narrative   Right handed    Caffeine use: Coffee once per day or less   Lives with husband and son, 1 dog   1 daughter lives in [place], [place] in [place].  No grandchildren.   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.         Updated 10/2020   Social History   Tobacco Use   Smoking status: Never   Smokeless tobacco: Never  Vaping Use   Vaping Use: Never used  Substance Use Topics    Alcohol use: Not Currently    Comment: very little   Drug use: No     Family History  Problem Relation Age of Onset   Breast cancer Mother 14   Hypertension Father    CAD Father        died from massive MI in 32's   Breast cancer Sister 6   Diabetes Sister    Hypertension Sister    Diabetes Sister        prediabetes   Hypertension Sister    Thyroid disease Sister    Hypercholesterolemia Sister    Hypertension Brother    Hypertension Brother    Hypertension Brother    Stroke Brother 29   Ovarian cancer Maternal Aunt    CAD Maternal Aunt        died in her sleep at 38   Stomach cancer Maternal Grandmother        unsure if this is the actual cancer   Ovarian cancer Cousin        maternal first cousin dx in her 63s     Outpatient Encounter Medications as of 10/05/2020  Medication Sig Note   ACCU-CHEK SOFTCLIX LANCETS lancets  06/21/2014: Received from: External Pharmacy   ASPIRIN 81 PO Take 1 tablet by mouth daily.    acetaZOLAMIDE (DIAMOX) 500 MG capsule Take 500 mg by mouth 2 (two) times daily. (Patient not taking: Reported on 10/05/2020)    colchicine 0.6 MG tablet At the start of a gout flare, take 2 tablets. After an hour, take 1 more tablet. Do no repeat dosing for 48 hours. (Patient not taking: Reported on 10/05/2020)    hydrOXYzine (ATARAX/VISTARIL) 25 MG tablet Take 25 mg by mouth 3 (three) times daily as needed. (Patient not taking: Reported on 10/05/2020)    ipratropium (ATROVENT) 0.06 % nasal spray Place 2 sprays into both nostrils 4 (four) times daily. (Patient not taking: Reported on 10/05/2020)    lisinopril-hydrochlorothiazide (ZESTORETIC) 20-12.5 MG tablet Take 1 tablet by mouth daily. (Patient not taking: Reported on 10/05/2020)    metFORMIN (GLUCOPHAGE-XR) 500 MG 24 hr tablet Take 500 mg by mouth 2 (two) times daily with a meal. 2 with evening meal (Patient not taking: Reported on 10/05/2020) 10/05/2020: She reports last prescription was 1054m tablet twice daily (gotten  through [facility].   ondansetron (ZOFRAN ODT) 4 MG disintegrating tablet Take 1 tablet (4 mg total) by mouth every 8 (eight) hours as needed for nausea or vomiting. (Patient not taking: Reported on 10/05/2020)    SUMAtriptan (IMITREX) 100 MG tablet Take 1 tab at onset of headache. May repeat in 2 hours if needed. Do not take more than 2 tablets in 24 hours or 4 tablets in a week. (Patient not  taking: Reported on 10/05/2020)    No facility-administered encounter medications on file as of 10/05/2020.   Allergies  Allergen Reactions   Shrimp [Shellfish Allergy] Anaphylaxis   ROS: sinus headache in forehead and cheeks today. No f/c, no purulent drainage, sore throat. Some cough since COVID. No numbness, tingling, weakness or dizziness.  No chest pain, palpitations, shortness of breath, edema.  No n/v/d/abdominal pain or urinary complaints. Denies rashes. Denies joint pains. Denies depression/anxiety. She gives a h/o "torn rectum" for which she saw Dr. Collene Mares.  No current issues (will request records). She previously took gabapentin, related to some neck and other pains, off for a while. Denies significant pain currently.     PHYSICAL EXAM:  BP (!) 144/90   Pulse 80   Ht 5' 2.5" (1.588 m)   Wt 226 lb 6.4 oz (102.7 kg)   BMI 40.75 kg/m   146/94 on repeat by MD  Wt Readings from Last 3 Encounters:  10/05/20 226 lb 6.4 oz (102.7 kg)  01/17/20 217 lb 9.5 oz (98.7 kg)  02/18/19 217 lb 8 oz (98.7 kg)   Pleasant, well-appearing, obese female, in good spirits. HEENT: conjunctiva and sclera are clear, EOMI. TM's and EAC's normal. Nasal mucosa with mild-mod edema, no drainage or erythema. Sinuses are tender x 4. OP is clear Neck: R thyroid lobe is diffusely enlarged, no nodules, nontender.  No lymphadenopathy or mass. No carotid bruit Heart: regular rate and rhythm, no murmur Lungs: clear bilaterally Back: no spinal or CVA tenderness Abdomen: soft, obese, nontender, no mass Extremities: no edema, 2+  pulses Skin: normal turgor, no visible rash Psych: normal mood, affect, hygiene and grooming Neuro: alert and oriented, cranial nerves grossly intact. Normal gait, strength   Lab Results  Component Value Date   HGBA1C 8.2 (A) 10/05/2020     ASSESSMENT/PLAN:  Poorly controlled type 2 diabetes mellitus ([place]) - since doesn't tolerate metformin well, will change back to XR, and start Ozempic. Risks/SE reviewed. f/u 4 wks - Plan: HgB A1c, CBC with Differential/Platelet, Comprehensive metabolic panel, TSH, Microalbumin / creatinine urine ratio, metFORMIN (GLUCOPHAGE-XR) 500 MG 24 hr tablet  Need for influenza vaccination - Plan: Flu Vaccine QUAD 6+ mos PF IM (Fluarix Quad PF)  Gout involving toe of right foot, unspecified cause, unspecified chronicity - 1 episode in July. Will stop HCTZ, which contributes to higher uric acid levels. - Plan: Uric acid  Essential hypertension - elevated today, noncompliant with meds.  Given ongoing cough, will start ARB rather than ACEI. f/u 4 weeks on BP - Plan: Comprehensive metabolic panel, valsartan (DIOVAN) 80 MG tablet  Hypertension associated with diabetes ([place])  Pseudotumor cerebri - currently asymptomatic. Unclear who has been rx'ing meds (and pt noncompliant). Will request [facility] records and get baseline labs.  Hyperlipidemia associated with type 2 diabetes mellitus ([place]) - not currently on statin. Need to review records to see why stopped (? due to elevated LFT, vs just ran out?).  Not fasting today. will f/u 4 wks fasting  Sinus pain - no evidence of infection. Treat for allergies  Allergic rhinitis, unspecified seasonality, unspecified trigger - encouraged to start OTC Flonase daily, with antihistamines prn.  First dose of Ozempic was given in office today, and pt was shown how to use pen.   Cbc, c-met,TSH, urine microalb/Cr, uric acid today (nonfasting) She will return in 4 weeks, and will be fasting. Hoping to have records from [facility] at  that time. Can check lipids at that time. Will need statin  restarted. Will need diabetic foot exam. Will need another b-met, due to starting ARB  Given samples of ozempic today, which should last 6 weeks. Start at 0.79m.  I spent 75 minutes dedicated to the care of this patient, including pre-visit review of records, face to face time, post-visit ordering of testing and documentation.

## 2020-10-04 ENCOUNTER — Ambulatory Visit: Payer: BLUE CROSS/BLUE SHIELD | Admitting: Family Medicine

## 2020-10-05 ENCOUNTER — Ambulatory Visit: Payer: 59 | Admitting: Family Medicine

## 2020-10-05 ENCOUNTER — Other Ambulatory Visit: Payer: Self-pay

## 2020-10-05 ENCOUNTER — Encounter: Payer: Self-pay | Admitting: Family Medicine

## 2020-10-05 VITALS — BP 144/90 | HR 80 | Ht 62.5 in | Wt 226.4 lb

## 2020-10-05 DIAGNOSIS — Z23 Encounter for immunization: Secondary | ICD-10-CM

## 2020-10-05 DIAGNOSIS — E1165 Type 2 diabetes mellitus with hyperglycemia: Secondary | ICD-10-CM

## 2020-10-05 DIAGNOSIS — E1159 Type 2 diabetes mellitus with other circulatory complications: Secondary | ICD-10-CM

## 2020-10-05 DIAGNOSIS — J3489 Other specified disorders of nose and nasal sinuses: Secondary | ICD-10-CM

## 2020-10-05 DIAGNOSIS — E1169 Type 2 diabetes mellitus with other specified complication: Secondary | ICD-10-CM

## 2020-10-05 DIAGNOSIS — J309 Allergic rhinitis, unspecified: Secondary | ICD-10-CM

## 2020-10-05 DIAGNOSIS — I10 Essential (primary) hypertension: Secondary | ICD-10-CM | POA: Diagnosis not present

## 2020-10-05 DIAGNOSIS — M109 Gout, unspecified: Secondary | ICD-10-CM | POA: Diagnosis not present

## 2020-10-05 DIAGNOSIS — I152 Hypertension secondary to endocrine disorders: Secondary | ICD-10-CM

## 2020-10-05 DIAGNOSIS — G932 Benign intracranial hypertension: Secondary | ICD-10-CM

## 2020-10-05 DIAGNOSIS — E785 Hyperlipidemia, unspecified: Secondary | ICD-10-CM

## 2020-10-05 LAB — POCT GLYCOSYLATED HEMOGLOBIN (HGB A1C): Hemoglobin A1C: 8.2 % — AB (ref 4.0–5.6)

## 2020-10-05 MED ORDER — VALSARTAN 80 MG PO TABS: 80.0000 mg | ORAL_TABLET | Freq: Every day | ORAL | 1 refills | Status: DC

## 2020-10-05 MED ORDER — METFORMIN HCL ER 500 MG PO TB24: ORAL_TABLET | ORAL | 1 refills | Status: DC

## 2020-10-05 MED ORDER — OZEMPIC (0.25 OR 0.5 MG/DOSE) 2 MG/1.5ML ~~LOC~~ SOPN: PEN_INJECTOR | SUBCUTANEOUS | 0 refills | Status: DC

## 2020-10-05 NOTE — Patient Instructions (Addendum)
We are changing your blood pressure medication to valsartan.  This shouldn't cause a cough and no longer contains the HCTZ which can contribute to higher uric acid levels and gout flares. (Still follow low purine diet and limit alcohol to also help prevent flares).  Please schedule a yearly diabetic exam and have them send [place] a copy of the report. Check with your insurance to find a covered provider.  We are lowering the dose of metformin to the extended release, 500mg .  You can either take them together before breakfast (or before dinner), but if you don't tolerate taking them together, you can split it, and take one before breakfast, and 1 before dinner.  We are also going to start you on a weekly injection, Ozempic. This is also for diabetes and should help with some weight loss.  You need to eat small amounts at a time or you will have issues with nausea and vomiting.  We start out at a very low dose, and gradually increase until sugars are at goal.  0.25mg  dose once weekly for 4 weeks, then change to 0.5mg  weekly.  This should last you for 6 weeks. This may last until your next visit, if not, we can send in a prescription if your appointment gets delayed.  Start flonase 2 gentle sniffs into each nostril once daily. You can also use claritin or zyrtec if needed for any runny nose, sneezing or other allergy symptoms. If you cough doesn't improve with the change in medications (blood pressure medications and starting flonase) you may use guaifenesin (mucinex or robitussin) as needed for cough   Low-Purine Eating Plan A low-purine eating plan involves making food choices to limit your intake of purine. Purine is a kind of uric acid. Too much uric acid in your blood can cause certain conditions, such as gout and kidney stones. Eating a low-purine diet can help control these conditions. What are tips for following this plan? Reading food labels Avoid foods with saturated or Trans fat. Check the  ingredient list of grains-based foods, such as bread and cereal, to make sure that they contain whole grains. Check the ingredient list of sauces or soups to make sure they do not contain meat or fish. When choosing soft drinks, check the ingredient list to make sure they do not contain high-fructose corn syrup. Shopping  Buy plenty of fresh fruits and vegetables. Avoid buying canned or fresh fish. Buy dairy products labeled as low-fat or nonfat. Avoid buying premade or processed foods. These foods are often high in fat, salt (sodium), and added sugar. Cooking Use olive oil instead of butter when cooking. Oils like olive oil, canola oil, and sunflower oil contain healthy fats. Meal planning Learn which foods do or do not affect you. If you find out that a food tends to cause your gout symptoms to flare up, avoid eating that food. You can enjoy foods that do not cause problems. If you have any questions about a food item, talk with your dietitian or health care provider. Limit foods high in fat, especially saturated fat. Fat makes it harder for your body to get rid of uric acid. Choose foods that are lower in fat and are lean sources of protein. General guidelines Limit alcohol intake to no more than 1 drink a day for nonpregnant women and 2 drinks a day for men. One drink equals 12 oz of beer, 5 oz of wine, or 1 oz of hard liquor. Alcohol can affect the way your  body gets rid of uric acid. Drink plenty of water to keep your urine clear or pale yellow. Fluids can help remove uric acid from your body. If directed by your health care provider, take a vitamin C supplement. Work with your health care provider and dietitian to develop a plan to achieve or maintain a healthy weight. Losing weight can help reduce uric acid in your blood. What foods are recommended? The items listed may not be a complete list. Talk with your dietitian about what dietary choices are best for you. Foods low in  purines Foods low in purines do not need to be limited. These include: All fruits. All low-purine vegetables, pickles, and olives. Breads, pasta, rice, cornbread, and popcorn. Cake and other baked goods. All dairy foods. Eggs, nuts, and nut butters. Spices and condiments, such as salt, herbs, and vinegar. Plant oils, butter, and margarine. Water, sugar-free soft drinks, tea, coffee, and cocoa. Vegetable-based soups, broths, sauces, and gravies. Foods moderate in purines Foods moderate in purines should be limited to the amounts listed.  cup of asparagus, cauliflower, spinach, mushrooms, or green peas, each day. 2/3 cup uncooked oatmeal, each day.  cup dry wheat bran or wheat germ, each day. 2-3 ounces of meat or poultry, each day. 4-6 ounces of shellfish, such as crab, lobster, oysters, or shrimp, each day. 1 cup cooked beans, peas, or lentils, each day. Soup, broths, or bouillon made from meat or fish. Limit these foods as much as possible. What foods are not recommended? The items listed may not be a complete list. Talk with your dietitian about what dietary choices are best for you. Limit your intake of foods high in purines, including: Beer and other alcohol. Meat-based gravy or sauce. Canned or fresh fish, such as: Anchovies, sardines, herring, and tuna. Mussels and scallops. Codfish, trout, and haddock. Tomasa Blase. Organ meats, such as: Liver or kidney. Tripe. Sweetbreads (thymus gland or pancreas). Wild Education officer, environmental. Yeast or yeast extract supplements. Drinks sweetened with high-fructose corn syrup. Summary Eating a low-purine diet can help control conditions caused by too much uric acid in the body, such as gout or kidney stones. Choose low-purine foods, limit alcohol, and limit foods high in fat. You will learn over time which foods do or do not affect you. If you find out that a food tends to cause your gout symptoms to flare up, avoid eating that food. This  information is not intended to replace advice given to you by your health care provider. Make sure you discuss any questions you have with your health care provider. Document Revised: 05/06/2019 Document Reviewed: 05/06/2019 Elsevier Patient Education  2022 [employer].

## 2020-10-06 LAB — COMPREHENSIVE METABOLIC PANEL
ALT: 20 IU/L (ref 0–32)
AST: 16 IU/L (ref 0–40)
Albumin/Globulin Ratio: 1.4 (ref 1.2–2.2)
Albumin: 4 g/dL (ref 3.8–4.9)
Alkaline Phosphatase: 100 IU/L (ref 44–121)
BUN/Creatinine Ratio: 17 (ref 9–23)
BUN: 19 mg/dL (ref 6–24)
Bilirubin Total: 0.6 mg/dL (ref 0.0–1.2)
CO2: 23 mmol/L (ref 20–29)
Calcium: 9.5 mg/dL (ref 8.7–10.2)
Chloride: 102 mmol/L (ref 96–106)
Creatinine, Ser: 1.14 mg/dL — ABNORMAL HIGH (ref 0.57–1.00)
Globulin, Total: 2.9 g/dL (ref 1.5–4.5)
Glucose: 229 mg/dL — ABNORMAL HIGH (ref 65–99)
Potassium: 3.9 mmol/L (ref 3.5–5.2)
Sodium: 141 mmol/L (ref 134–144)
Total Protein: 6.9 g/dL (ref 6.0–8.5)
eGFR: 57 mL/min/{1.73_m2} — ABNORMAL LOW (ref >59)

## 2020-10-06 LAB — CBC WITH DIFFERENTIAL/PLATELET
Basophils Absolute: 0 10*3/uL (ref 0.0–0.2)
Basos: 0 %
EOS (ABSOLUTE): 0.4 10*3/uL (ref 0.0–0.4)
Eos: 4 %
Hematocrit: 36.8 % (ref 34.0–46.6)
Hemoglobin: 11.9 g/dL (ref 11.1–15.9)
Immature Grans (Abs): 0 10*3/uL (ref 0.0–0.1)
Immature Granulocytes: 0 %
Lymphocytes Absolute: 2.2 10*3/uL (ref 0.7–3.1)
Lymphs: 20 %
MCH: 25.3 pg — ABNORMAL LOW (ref 26.6–33.0)
MCHC: 32.3 g/dL (ref 31.5–35.7)
MCV: 78 fL — ABNORMAL LOW (ref 79–97)
Monocytes Absolute: 0.6 10*3/uL (ref 0.1–0.9)
Monocytes: 6 %
Neutrophils Absolute: 7.7 10*3/uL — ABNORMAL HIGH (ref 1.4–7.0)
Neutrophils: 70 %
Platelets: 259 10*3/uL (ref 150–450)
RBC: 4.7 x10E6/uL (ref 3.77–5.28)
RDW: 14.4 % (ref 11.7–15.4)
WBC: 11 10*3/uL — ABNORMAL HIGH (ref 3.4–10.8)

## 2020-10-06 LAB — TSH: TSH: 1.24 u[IU]/mL (ref 0.450–4.500)

## 2020-10-06 LAB — MICROALBUMIN / CREATININE URINE RATIO
Creatinine, Urine: 326 mg/dL
Microalb/Creat Ratio: 11 mg/g creat (ref 0–29)
Microalbumin, Urine: 35.4 ug/mL

## 2020-10-06 LAB — URIC ACID: Uric Acid: 6.4 mg/dL (ref 3.0–7.2)

## 2020-10-11 NOTE — Progress Notes (Signed)
She can have refill of her colchicine (3 tablets). Please send info on gout or low purine diet (or see if she can look these up online as far as diet she should be following).  We can discuss preventative meds further when she comes next month. Her uric acid level wasn't particularly high, but if ongoing flares, we can consider daily meds.  For now, if still symptomatic with gout flare, can refill colchicine.

## 2020-10-26 ENCOUNTER — Telehealth: Payer: Self-pay | Admitting: Family Medicine

## 2020-10-26 NOTE — Telephone Encounter (Signed)
Records received from [facility] at [facility]

## 2020-10-31 ENCOUNTER — Other Ambulatory Visit: Payer: Self-pay | Admitting: Family Medicine

## 2020-10-31 DIAGNOSIS — I10 Essential (primary) hypertension: Secondary | ICD-10-CM

## 2020-10-31 DIAGNOSIS — E1165 Type 2 diabetes mellitus with hyperglycemia: Secondary | ICD-10-CM

## 2020-11-02 ENCOUNTER — Encounter: Payer: 59 | Admitting: Family Medicine

## 2020-11-06 ENCOUNTER — Encounter: Payer: Self-pay | Admitting: *Deleted

## 2020-11-12 NOTE — Progress Notes (Addendum)
Chief Complaint  Patient presents with   Diabetes    Fasting med check. Will get covid booster but would like to do on a Friday. I did not know where she had colonoscopy, she said she is not yet due and it was Dr.Mann-I will get. No imm records from [place] yet and I already put in what was in [place]. No new concerns today.     Patient presents for 4-6 week f/u from her initial visit.  Diabetes: A1c was elevated at last visit (8.2), when she had been out of metformin.  She had some tolerability issues with metformin, was switched to ER version, 1023m daily, and was started on 0.284mof Ozempic.   She is tolerating the metformin by taking it BID with meals (rather than at the same time). She is skipping it once a week, on the day she takes her Ozempic injection--she was feeling much worse if she took the metformin that day (nausea was worse). Only has mild nausea on ozempic day when NOT taking metformin, tolerable. Blood sugars are running 130-140 in the mornings (fasting).  In the afternoons she will see 140-150's, up to 160-170 if she "eats something I'm not supposed to"--coffee with regular sugar, pasta, potatoes. She is eating less overall and has lost some weight.  Per [facility] [place], she had been on Rosuvastatin 9m42maily. She doesn't recall exactly why stopped--either that she was told her lipids were good and she didn't need it, or that her liver tests were abnormal.  She has been off for a while.  She is fasting today for recheck. Last LDL at [facility] in 08/2018.  Lab Results  Component Value Date   HGBA1C 8.2 (A) 10/05/2020    Gout--HCTZ was stopped at her recent visit.  Uric acid was 6.4 while on the diuretic.  She had started having a flare of pain in her L great toe shortly after her visit.  She had colchicine to use, and this resolved.  She is currently having pain in the top of the R foot today, took a colchicine about an hour ago, not helping yet, unsure if it is gout.  First  noticed pain on Saturday, worse today.  She had worn new sneakers over the weekend.  Lab Results  Component Value Date   LABURIC 6.4 10/05/2020   Hypertension:  BP was high at last visit, hadn't been taking her medications (also had caused cough).  She was switched from ACEI to ARB (valsartan), and HCTZ was stopped due to gout.  She is due for b-met today. She hasn't been checking her blood pressure, doesn't have a monitor.  Pseudotumor cerebri - currently asymptomatic.     PMH, PSH, SH reviewed  Outpatient Encounter Medications as of 11/13/2020  Medication Sig Note   ACCU-CHEK SOFTCLIX LANCETS lancets  06/21/2014: Received from: External Pharmacy   ASPIRIN 81 PO Take 1 tablet by mouth daily.    colchicine 0.6 MG tablet At the start of a gout flare, take 2 tablets. After an hour, take 1 more tablet. Do no repeat dosing for 48 hours. 11/13/2020: Took 2 today   ipratropium (ATROVENT) 0.06 % nasal spray Place 2 sprays into both nostrils 4 (four) times daily.    metFORMIN (GLUCOPHAGE-XR) 500 MG 24 hr tablet TAKE 2 TABLETS WITH EITHER BREAKFAST OR DINNER. IF YOU CANNOT TOLERATE TAKING TOGETHER, THEN TAKE 1 WITH BREAKFAST AND 1 WITH DINNER.    Semaglutide,0.25 or 0.5MG/DOS, (OZEMPIC, 0.25 OR 0.5 MG/DOSE,) 2 MG/1.5ML  SOPN Inject 0.5 mg into the skin once a week.    valsartan (DIOVAN) 160 MG tablet Take 1 tablet (160 mg total) by mouth daily.    [DISCONTINUED] Semaglutide,0.25 or 0.5MG/DOS, (OZEMPIC, 0.25 OR 0.5 MG/DOSE,) 2 MG/1.5ML SOPN Start 0.52m once weekly x 4 weeks, then 0.5 mg weekly    [DISCONTINUED] valsartan (DIOVAN) 80 MG tablet TAKE 1 TABLET BY MOUTH EVERY DAY    acetaZOLAMIDE (DIAMOX) 500 MG capsule Take 500 mg by mouth 2 (two) times daily. (Patient not taking: No sig reported)    ondansetron (ZOFRAN ODT) 4 MG disintegrating tablet Take 1 tablet (4 mg total) by mouth every 8 (eight) hours as needed for nausea or vomiting. (Patient not taking: No sig reported)    SUMAtriptan (IMITREX)  100 MG tablet Take 1 tab at onset of headache. May repeat in 2 hours if needed. Do not take more than 2 tablets in 24 hours or 4 tablets in a week. (Patient not taking: No sig reported)    [DISCONTINUED] hydrOXYzine (ATARAX/VISTARIL) 25 MG tablet Take 25 mg by mouth 3 (three) times daily as needed. (Patient not taking: Reported on 10/05/2020)    No facility-administered encounter medications on file as of 11/13/2020.   Taking valsartan 840mand ozempic 0.25 prior to today's visit.  Allergies  Allergen Reactions   Shrimp [Shellfish Allergy] Anaphylaxis    ROS: Denies fever, chills, URI symptoms, headaches, dizziness, shortness of breath, chest pain.  Denies nausea, vomiting, bowel changes, urinary complaints, bleeding, bruising, rash. Gout in L great toe resolved.  Some pain across the top of the right foot since wearing new shoes. Cough resolved. Nausea, mild/tolerable, related to meds (though nausea is much worse if she takes metformin on day of ozempic injection).  Denies any diarrhea. See HPI   PHYSICAL EXAM:  BP (!) 154/100   Pulse 76   Ht 5' 2.5" (1.588 m)   Wt 220 lb 9.6 oz (100.1 kg)   BMI 39.71 kg/m  Wt Readings from Last 3 Encounters:  11/13/20 220 lb 9.6 oz (100.1 kg)  10/05/20 226 lb 6.4 oz (102.7 kg)  01/17/20 217 lb 9.5 oz (98.7 kg)   152/94 on repeat by MD  Pleasant, well-appearing female in no distress HEENT: conjunctiva and sclera are clear, EOMI, wearing mask Neck: no lymphadenopathy or mass Heart: regular rate and rhythm Lungs: clear bilaterally Abdomen: soft, nontender Extremities: no edema, normal pulses.   ASSESSMENT/PLAN:  Poorly controlled type 2 diabetes mellitus (HCC) - increase ozempic to 0.56m656mCont metformin ER 500m7mD. Cont proper diet, limiting carbs, and weight loss - Plan: Semaglutide,0.25 or 0.5MG/DOS, (OZEMPIC, 0.25 OR 0.5 MG/DOSE,) 2 MG/1.5ML SOPN  Essential hypertension - above goal. Increase valsartan to 160mg256m monitor BP at home  (and bring monitor to get verified) - Plan: valsartan (DIOVAN) 160 MG tablet  Medication monitoring encounter - Plan: Basic metabolic panel, Lipid panel  Hyperlipidemia associated with type 2 diabetes mellitus (HCC) Memphisecheck baseline lipids. Discussed reasons for statin regardless of LDL, due to DM. Await results to determine dose (If LDL low, restart 56mg d36my) - Plan: Lipid panel  Vaccine counseling - encouraged COVID bivalent booster; Shingrix (risks/SE reviewed).  Need to get dates of tetanus and pneumovax   Restart statin Crestor 56mg (A48mt results--if LDL much higher start higher dose).  Increase valsaraan to 160mg In70mse ozempic to 0.56mg   F/83m weeks.  Will need A1c, lipids, c-met, uric acid  ADDENDUM: Lab Results  Component Value Date  LDLCALC 130 (H) 11/13/2020   LDL much higher, will start crestor 75m rather than 5.

## 2020-11-13 ENCOUNTER — Encounter: Payer: Self-pay | Admitting: Family Medicine

## 2020-11-13 ENCOUNTER — Ambulatory Visit (INDEPENDENT_AMBULATORY_CARE_PROVIDER_SITE_OTHER): Payer: 59 | Admitting: Family Medicine

## 2020-11-13 ENCOUNTER — Other Ambulatory Visit: Payer: Self-pay

## 2020-11-13 VITALS — BP 154/100 | HR 76 | Ht 62.5 in | Wt 220.6 lb

## 2020-11-13 DIAGNOSIS — E1165 Type 2 diabetes mellitus with hyperglycemia: Secondary | ICD-10-CM

## 2020-11-13 DIAGNOSIS — E1169 Type 2 diabetes mellitus with other specified complication: Secondary | ICD-10-CM

## 2020-11-13 DIAGNOSIS — Z7185 Encounter for immunization safety counseling: Secondary | ICD-10-CM

## 2020-11-13 DIAGNOSIS — E785 Hyperlipidemia, unspecified: Secondary | ICD-10-CM

## 2020-11-13 DIAGNOSIS — I10 Essential (primary) hypertension: Secondary | ICD-10-CM

## 2020-11-13 DIAGNOSIS — Z5181 Encounter for therapeutic drug level monitoring: Secondary | ICD-10-CM

## 2020-11-13 MED ORDER — VALSARTAN 160 MG PO TABS: 160.0000 mg | ORAL_TABLET | Freq: Every day | ORAL | 3 refills | Status: DC

## 2020-11-13 MED ORDER — OZEMPIC (0.25 OR 0.5 MG/DOSE) 2 MG/1.5ML ~~LOC~~ SOPN: 0.5000 mg | PEN_INJECTOR | SUBCUTANEOUS | 2 refills | Status: DC

## 2020-11-13 NOTE — Patient Instructions (Addendum)
Please record your blood sugars. Keep a notebook with columns for date, morning, 2 hours after meal, bedtime, and also a column for "comments"--this is where you write if you forgot medication, ate the wrong thing, exercise. Bring this list to your visits.  Please also monitor your blood pressure--keep columns for morning, evening and comments (also to include if you have headache, salty foods, exercise). Please come for a nurse visit to check the accuracy of your monitor, so that we can know whether or not to trust the values you're getting at home.  Continue to try and limit sodium in your diet (salt). Try and walk or exercise daily.  Increase the Ozempic to 0.5mg  each week. You may notice some increased nausea with the dose change. Be sure to eat small amounts at a time to limit the nausea. Continue to take metformin twice daily.  We are increasing your valsartan dose from 80mg  to 160mg , as your blood pressure was above goal. You can double up on the remaining 80mg  tablets you have at home, but do NOT pick up the refill of the 80mg  dose.  We sent in a new prescription for 160mg . Take 1 daily of the higher dose.  We need to get your immunization records (only saw a flu shot given at [facility], unsure if they have a record of your last tetanus and pneumonia shots or not)--please try and get these records.  Tetanus is every 10 years. You should have pneumonia vaccine every 10 years as well.  I recommend getting the new shingles vaccine (Shingrix). You may want to check with your insurance to verify what your out of pocket cost may be (usually covered as preventative, but better to verify to avoid any surprises, as this vaccine is expensive), and then schedule a nurse visit at our office when convenient (based on the possible side effects as discussed).   This is a series of 2 injections, spaced 2 months apart.  It doesn't have to be exactly 2 months apart (but can't be sooner), if that isn't  feasible for your schedule, but try and get them close to 2 months (and definitely within 6 months of each other, or else the efficacy of the vaccine drops off). This should be separated from other vaccines by at least 2 weeks.  Given that you have diabetes, you need to be on a statin medication ot prevent cardiovascular disease. I'm going to wait to see today's cholesterol results.  If they are pretty good, then we will restart at the lowest dose (rosuvastatin 5mg  once daily). If you have any side effects (muscle aches), please let know, as we can adjust how it is taken.  Also, if you have any side effects, try taking coenzyme Q10 daily, as this often eliminates those side effects.

## 2020-11-14 LAB — BASIC METABOLIC PANEL
BUN/Creatinine Ratio: 19 (ref 9–23)
BUN: 18 mg/dL (ref 6–24)
CO2: 21 mmol/L (ref 20–29)
Calcium: 9.5 mg/dL (ref 8.7–10.2)
Chloride: 101 mmol/L (ref 96–106)
Creatinine, Ser: 0.95 mg/dL (ref 0.57–1.00)
Glucose: 112 mg/dL — ABNORMAL HIGH (ref 70–99)
Potassium: 4 mmol/L (ref 3.5–5.2)
Sodium: 143 mmol/L (ref 134–144)
eGFR: 71 mL/min/{1.73_m2} (ref >59)

## 2020-11-14 LAB — LIPID PANEL
Chol/HDL Ratio: 3.4 ratio (ref 0.0–4.4)
Cholesterol, Total: 212 mg/dL — ABNORMAL HIGH (ref 100–199)
HDL: 62 mg/dL (ref >39)
LDL Chol Calc (NIH): 130 mg/dL — ABNORMAL HIGH (ref 0–99)
Triglycerides: 112 mg/dL (ref 0–149)
VLDL Cholesterol Cal: 20 mg/dL (ref 5–40)

## 2020-11-14 MED ORDER — ROSUVASTATIN CALCIUM 10 MG PO TABS: 10.0000 mg | ORAL_TABLET | Freq: Every day | ORAL | 0 refills | Status: DC

## 2020-11-14 NOTE — Addendum Note (Signed)
Addended by: Joselyn Arrow on: 11/14/2020 09:12 AM   Modules accepted: Orders

## 2020-11-24 ENCOUNTER — Encounter: Payer: Self-pay | Admitting: Family Medicine

## 2020-11-24 ENCOUNTER — Other Ambulatory Visit: Payer: 59

## 2020-11-24 ENCOUNTER — Other Ambulatory Visit: Payer: Self-pay

## 2020-11-24 VITALS — BP 142/102

## 2020-11-24 DIAGNOSIS — Z23 Encounter for immunization: Secondary | ICD-10-CM

## 2020-11-24 NOTE — Progress Notes (Signed)
Patient in office today for blood pressure check - I manually got 142/102 and her home wrist monitor matched with a reading 147/100.   Checked B/P again 10 minutes after resting and I had reading of 140/98 and patients home monitor was 139/96. Informed if any changes need to be made to medications will call her next week and let her know

## 2020-11-25 ENCOUNTER — Other Ambulatory Visit: Payer: Self-pay | Admitting: Medical

## 2020-11-25 DIAGNOSIS — E1165 Type 2 diabetes mellitus with hyperglycemia: Secondary | ICD-10-CM

## 2020-12-23 ENCOUNTER — Other Ambulatory Visit: Payer: Self-pay | Admitting: Family Medicine

## 2020-12-23 DIAGNOSIS — E1169 Type 2 diabetes mellitus with other specified complication: Secondary | ICD-10-CM

## 2020-12-23 DIAGNOSIS — E785 Hyperlipidemia, unspecified: Secondary | ICD-10-CM

## 2020-12-31 ENCOUNTER — Other Ambulatory Visit: Payer: Self-pay | Admitting: Family Medicine

## 2020-12-31 DIAGNOSIS — E1165 Type 2 diabetes mellitus with hyperglycemia: Secondary | ICD-10-CM

## 2021-01-14 DIAGNOSIS — E119 Type 2 diabetes mellitus without complications: Secondary | ICD-10-CM | POA: Insufficient documentation

## 2021-01-14 NOTE — Patient Instructions (Addendum)
  You are due for repeat colonoscopy with Dr. Loreta Ave (per her records, 5 year follow-up was due 07/2020).  Please contact her office to schedule. If this recommendation has changed due to change in the guidelines, please let us know the updated recommendation so that we can change the date in our chart (and be able to remind you of when it is truly due).  Please be sure to get yearly diabetic eye exams (and ask them to send [place] a copy of the report).  We do not have records of your immunizations.  I would like to know when you had your last tetanus (Td or TdaP), and any pneumonia vaccines (date and which type).  I recommend getting the new shingles vaccine (Shingrix). We verified coverage by [facility].  Your 2nd vaccines (if not also your first, depending on when you get it) may be with your new insurance--you may want to verify what your out of pocket cost may be (usually covered as preventative, but better to verify to avoid any surprises, as this vaccine is expensive), and then schedule a nurse visit at our office when convenient (based on the possible side effects as discussed).   This is a series of 2 injections, spaced 2 months apart.  It doesn't have to be exactly 2 months apart (but can't be sooner), if that isn't feasible for your schedule, but try and get them close to 2 months (and definitely within 6 months of each other, or else the efficacy of the vaccine drops off). This should be separated from other vaccines by at least 2 weeks.

## 2021-01-14 NOTE — Progress Notes (Signed)
Chief Complaint  Patient presents with   Diabetes    Fasting med check. Patient states right knee has been hurting her for the last few days. She has not had a diabetic eye exam for at least 2 years-she cannot remember where her last one was. She said she forgot to call [facility] to ask them to send vaccines-I will try to call now. She will take shingrix today (I can ask Melissa if [facility] pays?) She does not see a GYN, prefers to do that here when she schedules CPE. No other concerns.(Already had covid booster at [facility] 10/21-was not entered)    Patient presents for 2 month follow-up on diabetes, hypertension and hyperlipidemia.  She is complaining of aching in R knee since Friday. It is a little better now.  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  No known injury or fall.  It never swelled.  Pain is over the patellar tendon (centrally just below her kneecap).  Diabetes: Ozempic dose was increased to 0.5mg  at her last visit.  She missed the medication for 2 weeks when the pharmacy was out of stock.  She is tolerating this well. She continues on metformin ER 500mg  BID. She no longer has any nausea with taking metformin and ozempic on the same day.  Lab Results  Component Value Date   HGBA1C 8.2 (A) 10/05/2020   Sugars are running 106-173 fasting, mostly 120-145.  2 hours postprandial 131-167. Last eye exam was 2 years ago, denies any vision problems. She checks feet regularly, no numbness, tingling, lesions/concerns.  Hypertension:  BP was above goal at her last visit, when on 80mg  of valsartan. Dose was increased to 160mg .  Previously had also taken HCTZ, which was stopped due to gout.  She is tolerating valsartan without side effects.  No chest pain, palpitations, denies edema. Infrequent headaches. BP's have been running mostly 120-130/81-92.  Once 157/92.  Some sodium noted in her diet (chips, soups, egg roll, crackers). Her wrist monitor was verified as accurate  11/2020.  Hyperlipidemia: She was started on rosuvastatin 10mg  after last labs. She is tolerating the medication without side effects. She tries to follow a low cholesterol diet. She is due for recheck today.  Lab Results  Component Value Date   CHOL 212 (H) 11/13/2020   HDL 62 11/13/2020   LDLCALC 130 (H) 11/13/2020   TRIG 112 11/13/2020   CHOLHDL 3.4 11/13/2020   Gout--diagnosed with a flare in L great toe in 08/2020, treated with colchicine. This had occurred after a weekend with high alcohol intake. Since then, HCTZ was stopped. Denies any recent problems with joint pain/swelling.  Lab Results  Component Value Date   LABURIC 6.4 10/05/2020   PMH, PSH, SH reviewed  Outpatient Encounter Medications as of 01/15/2021  Medication Sig Note   ACCU-CHEK SOFTCLIX LANCETS lancets  06/21/2014: Received from: External Pharmacy   ASPIRIN 81 PO Take 1 tablet by mouth daily.    metFORMIN (GLUCOPHAGE-XR) 500 MG 24 hr tablet TAKE 2 TABLETS WITH EITHER BREAKFAST OR DINNER. IF YOU CANNOT TOLERATE TAKING TOGETHER, THEN TAKE 1 WITH BREAKFAST AND 1 WITH DINNER.    rosuvastatin (CRESTOR) 10 MG tablet Take 1 tablet (10 mg total) by mouth daily.    Semaglutide,0.25 or 0.5MG /DOS, (OZEMPIC, 0.25 OR 0.5 MG/DOSE,) 2 MG/1.5ML SOPN Inject 0.5 mg into the skin once a week.    valsartan (DIOVAN) 160 MG tablet Take 1 tablet (160 mg total) by mouth daily.  acetaZOLAMIDE (DIAMOX) 500 MG capsule Take 500 mg by mouth 2 (two) times daily. (Patient not taking: Reported on 10/05/2020)    colchicine 0.6 MG tablet At the start of a gout flare, take 2 tablets. After an hour, take 1 more tablet. Do no repeat dosing for 48 hours. (Patient not taking: Reported on 01/15/2021)    ipratropium (ATROVENT) 0.06 % nasal spray Place 2 sprays into both nostrils 4 (four) times daily. (Patient not taking: Reported on 01/15/2021) 01/15/2021: Uses as needed   ondansetron (ZOFRAN ODT) 4 MG disintegrating tablet Take 1 tablet (4 mg total) by  mouth every 8 (eight) hours as needed for nausea or vomiting. (Patient not taking: Reported on 10/05/2020)    SUMAtriptan (IMITREX) 100 MG tablet Take 1 tab at onset of headache. May repeat in 2 hours if needed. Do not take more than 2 tablets in 24 hours or 4 tablets in a week. (Patient not taking: Reported on 10/05/2020) 01/15/2021: Uses as needed for migraine   No facility-administered encounter medications on file as of 01/15/2021.   Allergies  Allergen Reactions   Shrimp [Shellfish Allergy] Anaphylaxis    ROS: no headaches, dizziness, fever, chills, URI symptoms, chest pain, shortness of breath. No n/v/d, bleeding, rashes.  Moods are good. R knee pain per HPI.   PHYSICAL EXAM:  BP 136/86   Pulse 84   Ht 5' 2.5" (1.588 m)   Wt 218 lb 6.4 oz (99.1 kg)   BMI 39.31 kg/m   Wt Readings from Last 3 Encounters:  01/15/21 218 lb 6.4 oz (99.1 kg)  11/13/20 220 lb 9.6 oz (100.1 kg)  10/05/20 226 lb 6.4 oz (102.7 kg)   Pleasant, well-appearing female in no distress HEENT: conjunctiva and sclera are clear, EOMI, wearing mask Neck: no lymphadenopathy. Thyromegaly is again noted. Heart: regular rate and rhythm Lungs: clear bilaterally Abdomen: soft, nontender, no masses Back: no spinal or CVA tenderness Extremities: no edema, normal pulses. Normal sensation to monofilament exam. No lesions, rashes. +Tender at patellar tendon, and +pain with quad testing No swelling/warmth appreciable, but limited exam (wearing pants) Neuro: alert and oriented, normal gait Psych: normal mood, affect, hygiene and grooming   Lab Results  Component Value Date   HGBA1C 6.7 (A) 01/15/2021     ASSESSMENT/PLAN:  Type 2 diabetes mellitus with other specified complication, without long-term current use of insulin (HCC) - Improved control on metformin and Ozempic. Pt interested in further titrating up dose. Risks/SE reviewed - Plan: HgB A1c, Comprehensive metabolic panel, metFORMIN (GLUCOPHAGE-XR) 500 MG 24  hr tablet, Semaglutide, 1 MG/DOSE, 4 MG/3ML SOPN  Hyperlipidemia associated with type 2 diabetes mellitus (HCC) - tolerating crestor, due for recheck of lipids - Plan: Lipid panel  Hypertension associated with diabetes (HCC) - BP improved, still often above goal. Reviewed low Na diet (hasn't always been careful), cont wt loss. Cont current meds, monitoring  Gout involving toe of right foot, unspecified cause, unspecified chronicity - Plan: Uric acid  Medication monitoring encounter - Plan: Lipid panel, Comprehensive metabolic panel, Uric acid  Need for Tdap vaccination - Plan: Tdap vaccine greater than or equal to 7yo IM  Need for hepatitis C screening test - Plan: Hepatitis C antibody  Screening for HIV (human immunodeficiency virus) - Plan: HIV Antibody (routine testing w rflx)  Patellar tendinitis of right knee - Improving since no longer jumping.  Ice, NSAID prn.    Patient reminded to schedule diabetic eye exam. Discussed continuing same meds vs titrating up ozempic to 1mg --she  wants to titrate up Vaccine history came from [facility] during visit--TdaP 2009.  Booster given today. Pneumovax was 02/05/13 Declines Shingrix today, prefers to return for [place].  Has never had HepC screening.  HIV possibly done 10 years ago, okay to repeat. Hasn't seen GYN in 2 years.  Prefers to have GYN exam done here. Will need to schedule CPE Colonoscopy was due with Dr. Loreta Ave in 07/2020, per records received. She states seh contacted their office earlier in the year, was told she wasn't due. Encouraged her to call again (to see if due, or to see if guidelines changed and to find out when due next).

## 2021-01-15 ENCOUNTER — Encounter: Payer: Self-pay | Admitting: Family Medicine

## 2021-01-15 ENCOUNTER — Other Ambulatory Visit: Payer: Self-pay

## 2021-01-15 ENCOUNTER — Ambulatory Visit (INDEPENDENT_AMBULATORY_CARE_PROVIDER_SITE_OTHER): Payer: 59 | Admitting: Family Medicine

## 2021-01-15 VITALS — BP 136/86 | HR 84 | Ht 62.5 in | Wt 218.4 lb

## 2021-01-15 DIAGNOSIS — E785 Hyperlipidemia, unspecified: Secondary | ICD-10-CM

## 2021-01-15 DIAGNOSIS — M109 Gout, unspecified: Secondary | ICD-10-CM

## 2021-01-15 DIAGNOSIS — Z114 Encounter for screening for human immunodeficiency virus [HIV]: Secondary | ICD-10-CM

## 2021-01-15 DIAGNOSIS — Z1159 Encounter for screening for other viral diseases: Secondary | ICD-10-CM

## 2021-01-15 DIAGNOSIS — E1159 Type 2 diabetes mellitus with other circulatory complications: Secondary | ICD-10-CM | POA: Diagnosis not present

## 2021-01-15 DIAGNOSIS — Z5181 Encounter for therapeutic drug level monitoring: Secondary | ICD-10-CM

## 2021-01-15 DIAGNOSIS — E1169 Type 2 diabetes mellitus with other specified complication: Secondary | ICD-10-CM

## 2021-01-15 DIAGNOSIS — I152 Hypertension secondary to endocrine disorders: Secondary | ICD-10-CM

## 2021-01-15 DIAGNOSIS — Z23 Encounter for immunization: Secondary | ICD-10-CM

## 2021-01-15 DIAGNOSIS — M7651 Patellar tendinitis, right knee: Secondary | ICD-10-CM

## 2021-01-15 LAB — POCT GLYCOSYLATED HEMOGLOBIN (HGB A1C): Hemoglobin A1C: 6.7 % — AB (ref 4.0–5.6)

## 2021-01-15 MED ORDER — METFORMIN HCL ER 500 MG PO TB24: 500.0000 mg | ORAL_TABLET | Freq: Two times a day (BID) | ORAL | 1 refills | Status: DC

## 2021-01-15 MED ORDER — SEMAGLUTIDE (1 MG/DOSE) 4 MG/3ML ~~LOC~~ SOPN: 1.0000 mg | PEN_INJECTOR | SUBCUTANEOUS | 1 refills | Status: DC

## 2021-01-16 LAB — COMPREHENSIVE METABOLIC PANEL
ALT: 21 IU/L (ref 0–32)
AST: 22 IU/L (ref 0–40)
Albumin/Globulin Ratio: 1.3 (ref 1.2–2.2)
Albumin: 4.3 g/dL (ref 3.8–4.9)
Alkaline Phosphatase: 110 IU/L (ref 44–121)
BUN/Creatinine Ratio: 18 (ref 9–23)
BUN: 17 mg/dL (ref 6–24)
Bilirubin Total: 0.6 mg/dL (ref 0.0–1.2)
CO2: 25 mmol/L (ref 20–29)
Calcium: 9.5 mg/dL (ref 8.7–10.2)
Chloride: 104 mmol/L (ref 96–106)
Creatinine, Ser: 0.95 mg/dL (ref 0.57–1.00)
Globulin, Total: 3.2 g/dL (ref 1.5–4.5)
Glucose: 105 mg/dL — ABNORMAL HIGH (ref 70–99)
Potassium: 4.1 mmol/L (ref 3.5–5.2)
Sodium: 142 mmol/L (ref 134–144)
Total Protein: 7.5 g/dL (ref 6.0–8.5)
eGFR: 71 mL/min/{1.73_m2} (ref >59)

## 2021-01-16 LAB — URIC ACID: Uric Acid: 5.8 mg/dL (ref 3.0–7.2)

## 2021-01-16 LAB — LIPID PANEL
Chol/HDL Ratio: 2.9 ratio (ref 0.0–4.4)
Cholesterol, Total: 176 mg/dL (ref 100–199)
HDL: 60 mg/dL (ref >39)
LDL Chol Calc (NIH): 90 mg/dL (ref 0–99)
Triglycerides: 151 mg/dL — ABNORMAL HIGH (ref 0–149)
VLDL Cholesterol Cal: 26 mg/dL (ref 5–40)

## 2021-01-16 LAB — HIV ANTIBODY (ROUTINE TESTING W REFLEX): HIV Screen 4th Generation wRfx: NONREACTIVE

## 2021-01-16 LAB — HEPATITIS C ANTIBODY: Hep C Virus Ab: 0.3 s/co ratio (ref 0.0–0.9)

## 2021-01-16 MED ORDER — ROSUVASTATIN CALCIUM 10 MG PO TABS: 10.0000 mg | ORAL_TABLET | Freq: Every day | ORAL | 3 refills | Status: DC

## 2021-01-24 ENCOUNTER — Encounter: Payer: Self-pay | Admitting: Internal Medicine

## 2021-02-02 ENCOUNTER — Other Ambulatory Visit (INDEPENDENT_AMBULATORY_CARE_PROVIDER_SITE_OTHER): Payer: 59

## 2021-02-02 ENCOUNTER — Other Ambulatory Visit: Payer: Self-pay

## 2021-02-02 DIAGNOSIS — Z23 Encounter for immunization: Secondary | ICD-10-CM | POA: Diagnosis not present

## 2021-02-13 IMAGING — CR DG CHEST 2V
2 series · 2 of 2 positions shown · non-contrast
Comparison: 01/11/2013

CLINICAL DATA: Lump at the left clavicle.

EXAM:
CHEST - 2 VIEW

[chest pa]
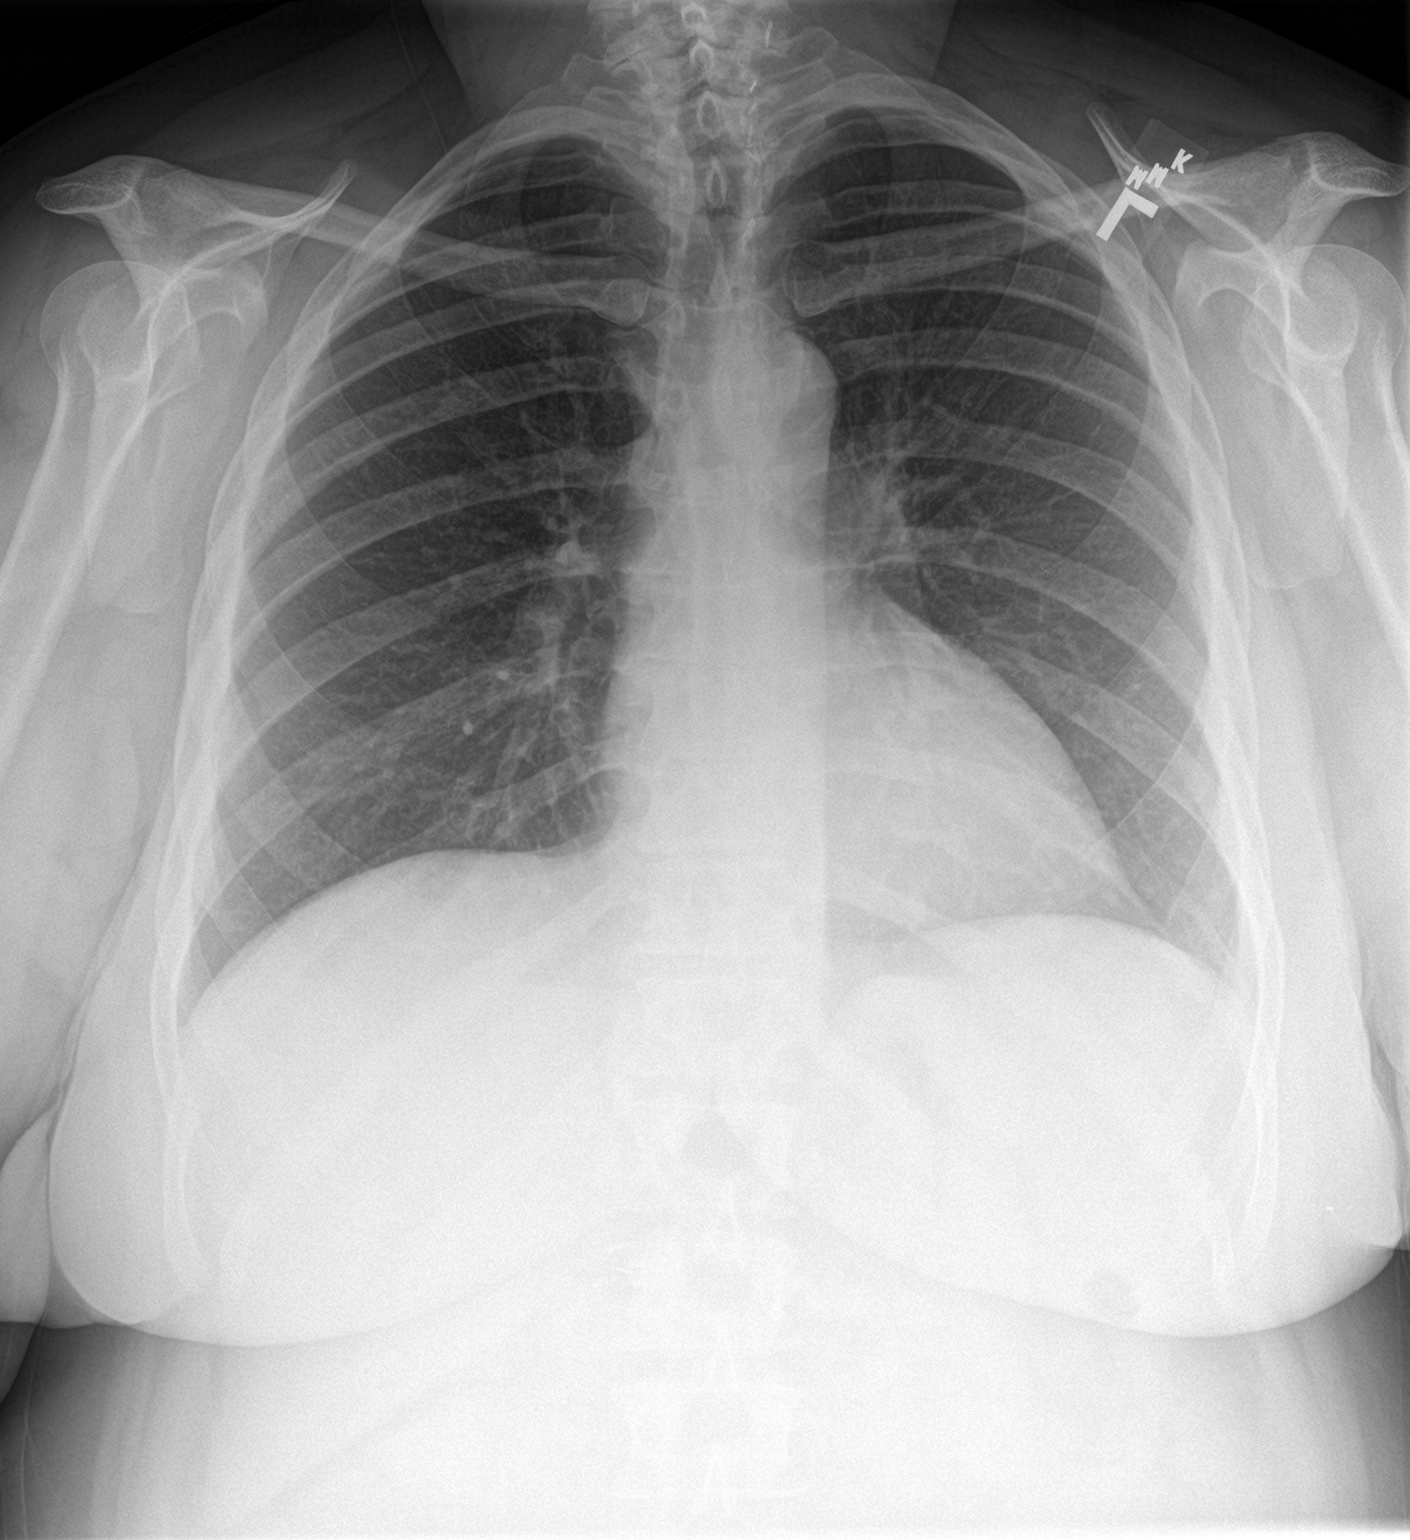

[chest lat]
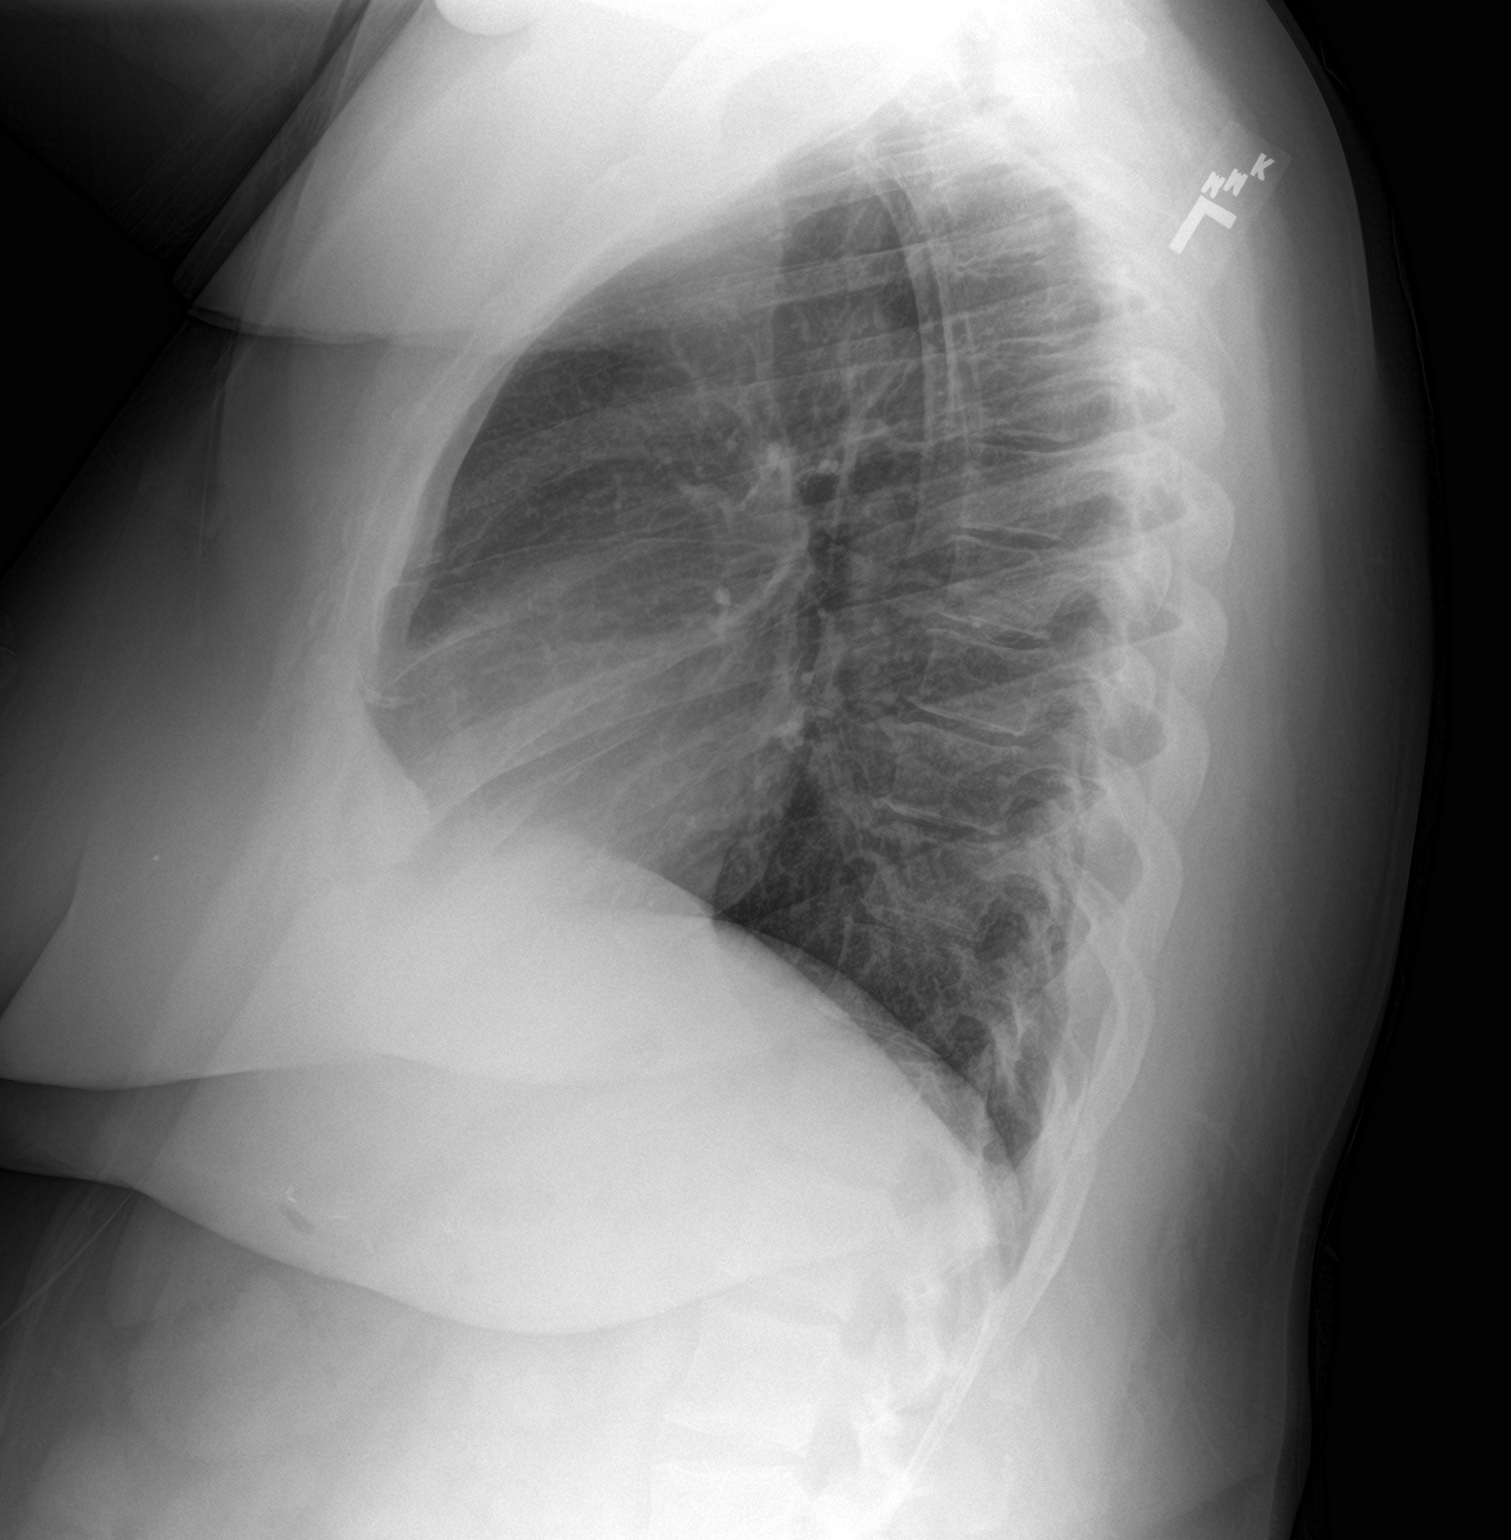

[2 of 2 positions shown; findings below may reference images not displayed]

FINDINGS: The lungs are clear without focal pneumonia, edema, pneumothorax or
pleural effusion. The cardiopericardial silhouette is within normal
limits for size. No bony abnormality seen in the left clavicle.
IMPRESSION: Stable.  No acute cardiopulmonary findings.

## 2021-03-25 ENCOUNTER — Other Ambulatory Visit: Payer: Self-pay | Admitting: Family Medicine

## 2021-03-25 DIAGNOSIS — E1169 Type 2 diabetes mellitus with other specified complication: Secondary | ICD-10-CM

## 2021-03-26 ENCOUNTER — Telehealth: Payer: Self-pay

## 2021-03-26 NOTE — Telephone Encounter (Signed)
Pt states new Friday ins requiring P.A. for Ozempic,  nothing received form pharmacy, she will send in ins card thru Mychart

## 2021-03-26 NOTE — Telephone Encounter (Signed)
PA needed please

## 2021-03-28 NOTE — Telephone Encounter (Signed)
P.A. done and approved, pt informed

## 2021-03-28 NOTE — Telephone Encounter (Signed)
P.A. OZEMPIC done and approved, called pharmacy went thru for $250, activated discount card and price went down to $100.  Called pt and informed.

## 2021-04-13 ENCOUNTER — Other Ambulatory Visit (INDEPENDENT_AMBULATORY_CARE_PROVIDER_SITE_OTHER): Payer: 59

## 2021-04-13 ENCOUNTER — Other Ambulatory Visit: Payer: Self-pay

## 2021-04-13 DIAGNOSIS — Z23 Encounter for immunization: Secondary | ICD-10-CM

## 2021-04-27 ENCOUNTER — Other Ambulatory Visit: Payer: Self-pay | Admitting: Family Medicine

## 2021-04-27 DIAGNOSIS — E1169 Type 2 diabetes mellitus with other specified complication: Secondary | ICD-10-CM

## 2021-04-27 NOTE — Telephone Encounter (Signed)
Keep it the same please.  Her next visit is 4/6 when we will be checking A1c again. Cont 1mg . ? ?

## 2021-05-10 ENCOUNTER — Encounter: Payer: 59 | Admitting: Family Medicine

## 2021-05-15 NOTE — Patient Instructions (Addendum)
?HEALTH MAINTENANCE RECOMMENDATIONS: ? ?It is recommended that you get at least 30 minutes of aerobic exercise at least 5 days/week (for weight loss, you may need as much as 60-90 minutes). This can be any activity that gets your heart rate up. This can be divided in 10-15 minute intervals if needed, but try and build up your endurance at least once a week.  Weight bearing exercise is also recommended twice weekly. ? ?Eat a healthy diet with lots of vegetables, fruits and fiber.  "Colorful" foods have a lot of vitamins (ie green vegetables, tomatoes, red peppers, etc).  Limit sweet tea, regular sodas and alcoholic beverages, all of which has a lot of calories and sugar.  Up to 1 alcoholic drink daily may be beneficial for women (unless trying to lose weight, watch sugars).  Drink a lot of water. ? ?Calcium recommendations are 1200-1500 mg daily (1500 mg for postmenopausal women or women without ovaries), and vitamin D 1000 IU daily.  This should be obtained from diet and/or supplements (vitamins), and calcium should not be taken all at once, but in divided doses. ? ?Monthly self breast exams and yearly mammograms for women over the age of 78 is recommended. ? ?Sunscreen of at least SPF 30 should be used on all sun-exposed parts of the skin when outside between the hours of 10 am and 4 pm (not just when at beach or pool, but even with exercise, golf, tennis, and yard work!)  Use a sunscreen that says "broad spectrum" so it covers both UVA and UVB rays, and make sure to reapply every 1-2 hours. ? ?Remember to change the batteries in your smoke detectors when changing your clock times in the spring and fall. Carbon monoxide detectors are recommended for your home. ? ?Use your seat belt every time you are in a car, and please drive safely and not be distracted with cell phones and texting while driving. ? ?Please try and check your sugars at least 2x/week, more if feeling bad or running high or low. ? ?Your morning  headaches are most likely related to allergies.  You can take claritin, or allegra, or zyrtec once daily.  ? ?Please schedule your yearly diabetic eye exam, and ask them to forward [place] the report. ? ?Please schedule your mammogram. ? ?I believe you are past due for colonoscopy with Dr. Loreta Ave.  Please contact her office to see if you need to be scheduled (vs if guidelines changed, and your recommended date has changed). ? ?Please schedule a routine dental cleaning. ? ?Ask your family if you take pauses in your breathing (snoring stops, holding breath, and then restart breathing). If they hear this happening often, we should do a home sleep to evaluate for sleep apnea. ?Or, if you find that you get a good night sleep (>7 hours), but you wake up feeling unrefreshed, or very tired throughout the day. ? ?Consider trying coconut oil as a lubricant. ? ?Your blood pressure was above goal--possibly due to stressors, sodium, and not getting adequate exercise all contributing. ?Blood pressure goal is <130/80. ?Start taking amlodipine 2.5 mg once daily. ?Monitor your blood pressure 3-5x/week, record, and send [place] a list within a month or so. ?If your blood pressure drops to <100/60, then you can stop it ?If you notice a lot of dizziness--if your blood pressure is low, then stop the medication. ?The dizziness can be for other reasons (dehydration, low sugar), so continue the amlodipine if BP is normal when you feel dizzy. ? ? ? ?

## 2021-05-15 NOTE — Progress Notes (Signed)
Chief Complaint  ?Patient presents with  ? cpe  ?  Fasting cpe, want to discuss DM medications. Needs pap and breast exam today, bump will sometime pop up on her labia and will go down. No DM eye exam, no mammo or colonoscopy. PQ-9 abnormal  ? ? ?Crystal Mercado is a 56 y.o. female who presents for a complete physical and follow-up on chronic problems.   ? ?She admits to being a little stressed--daughter is in the hospital with preterm labor at 30 weeks. ? ?She is complaining of waking up with slight frontal headache, down to the nose.  Tylenol relieves it.  +sniffling, sneezing, runny nose. Denies cough, sore throat or fever. ?She does report having some h/o allergies, but hasn't taken anything yet. ? ?Diabetes: Ozempic dose was increased to 1mg  at her last visit in 01/2021. She had some nausea and vomiting 1-2x/week for 2 weeks; no issues any other time since on the higher dose (?if related to medication or not). This was 2 and 3 weeks ago.  No n/v in the last 2 weeks. ?She admits that she cut back on metformin ER to taking it just once daily about 2 months ago.  She had more nausea when she took it twice daily.  Never had diarrhea, just queasy stomach.  It is better since she cut back.  ?Last A1c was 6.7% in 01/2021. ?Sugars haven't been checked recently. ?Last eye exam was 2 years ago, denies any vision problems. ?She checks feet regularly, no numbness, tingling, lesions/concerns. ?  ?Hypertension:  Compliant with taking valsartan 160mg  daily, denies side effects.  Previously had also taken HCTZ, which was stopped due to gout.   ?She denies chest pain, palpitations, edema. She notes some lightheadedness (has some now, when fasting).  Denies orthostasis. ?Frontal headaches as above (likely allergies). ?BP's have been running 130-145/80-90. ?She has cut back on the sodium in her diet. When she was sick, she had clear broth, and BP's were higher (145/90).   ?Her wrist monitor was verified as accurate 11/2020. ?   ?Hyperlipidemia: She reports compliance with rosuvastatin 10mg , and denies side effects. She tries to follow a low cholesterol diet. ?Lab Results  ?Component Value Date  ? CHOL 176 01/15/2021  ? HDL 60 01/15/2021  ? [place] 90 01/15/2021  ? TRIG 151 (H) 01/15/2021  ? CHOLHDL 2.9 01/15/2021  ? ?  ?Gout--diagnosed with a flare in L great toe in 08/2020, treated with colchicine. This had occurred after a weekend with high alcohol intake. Since then, HCTZ was stopped. Denies any recent problems with joint pain/swelling. ?Lab Results  ?Component Value Date  ? LABURIC 5.8 01/15/2021  ? ? ? ?Immunization History  ?Administered Date(s) Administered  ? Influenza,inj,Quad PF,6+ Mos 10/05/2020  ? Influenza-Unspecified 12/28/2008, 02/05/2013, 12/01/2013, 10/17/2016, 01/20/2018, 04/12/2019  ? PFIZER(Purple Top)SARS-COV-2 Vaccination 04/03/2019, 04/30/2019, 01/15/2020  ? PPD Test 11/16/2015  ? Pension scheme manager 82yrs & up 11/24/2020  ? Pneumococcal Polysaccharide-23 02/05/2013  ? Tdap 07/29/2007, 01/15/2021  ? Zoster Recombinat (Shingrix) 02/02/2021, 04/13/2021  ? ?Last Pap smear: 06/2017 (?); due. No longer plans to see GYN ?Last mammogram: 04/2019 ?Last colonoscopy: 07/2015 with Dr. Collene Mares.  Adenomatous polyp.  5 yr f/u rec ?Last DEXA: never ?Dentist: many years ago ?Ophtho: past due (2 years ago) ?Exercise: None.  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 ? ? ? ?PMH, PSH, SH and FH  were reviewed and updated ? ?Outpatient Encounter Medications as of 05/16/2021  ?Medication Sig Note  ? ASPIRIN 81 PO Take 1 tablet by mouth daily.   ? metFORMIN (GLUCOPHAGE-XR) 500 MG 24 hr tablet Take 1 tablet (500 mg total) by mouth 2 (two) times daily before a meal.   ? Multiple Vitamins-Minerals (HAIR SKIN AND NAILS FORMULA PO) Take by mouth.   ? OZEMPIC, 1 MG/DOSE, 4 MG/3ML SOPN INJECT 1 MG ONCE A WEEK AS DIRECTED   ?  rosuvastatin (CRESTOR) 10 MG tablet Take 1 tablet (10 mg total) by mouth daily.   ? valsartan (DIOVAN) 160 MG tablet Take 1 tablet (160 mg total) by mouth daily.   ? ACCU-CHEK SOFTCLIX LANCETS lancets  (Patient not taking: Reported on 05/16/2021) 06/21/2014: Received from: External Pharmacy  ? acetaZOLAMIDE (DIAMOX) 500 MG capsule Take 500 mg by mouth 2 (two) times daily. (Patient not taking: Reported on 10/05/2020)   ? colchicine 0.6 MG tablet At the start of a gout flare, take 2 tablets. After an hour, take 1 more tablet. Do no repeat dosing for 48 hours. (Patient not taking: Reported on 01/15/2021)   ? ipratropium (ATROVENT) 0.06 % nasal spray Place 2 sprays into both nostrils 4 (four) times daily. (Patient not taking: Reported on 01/15/2021) 01/15/2021: Uses as needed  ? ondansetron (ZOFRAN ODT) 4 MG disintegrating tablet Take 1 tablet (4 mg total) by mouth every 8 (eight) hours as needed for nausea or vomiting. (Patient not taking: Reported on 10/05/2020)   ? SUMAtriptan (IMITREX) 100 MG tablet Take 1 tab at onset of headache. May repeat in 2 hours if needed. Do not take more than 2 tablets in 24 hours or 4 tablets in a week. (Patient not taking: Reported on 10/05/2020) 01/15/2021: Uses as needed for migraine  ? ?No facility-administered encounter medications on file as of 05/16/2021.  ? ?Allergies  ?Allergen Reactions  ? Shrimp [Shellfish Allergy] Anaphylaxis  ? ? ?ROS: The patient denies fever, vision changes, decreased hearing, ear pain, sore throat, breast concerns, chest pain, palpitations, dizziness, syncope, dyspnea on exertion, cough, swelling, nausea, vomiting (resolved, per HPI), diarrhea, constipation, abdominal pain, melena, hematochezia, indigestion/heartburn, hematuria, incontinence, dysuria, vaginal discharge, odor or itch, genital lesions, joint pains, numbness, tingling, weakness, tremor, suspicious skin lesions, depression, anxiety (just some stress related to daughter), abnormal bleeding/bruising, or  enlarged lymph nodes. ?No vaginal bleeding since her ablation. +hot flashes, mild ?Some blurred vision today, due to fasting, r/b eating granola bar in office.  No other vision complaints. ?Pain with intercourse.  She tried lubricants--too sensitive, irritated her. ?+snoring.  Unsure about apnea. ?She has insomnia, only sleeping 3 hours, so has fatigue.  Wakes up and can't get back to sleep. ?Some dizziness when she hasn't eaten, and headache--all had at beginning of visit, resolved with eating. ?+weight loss--decreased appetite, not eating as much (on ozempic) ? ? ?PHYSICAL EXAM: ? ?BP 134/80   Pulse 68   Ht 5' 2.5" (1.588 m)   Wt 205 lb (93 kg)   BMI 36.90 kg/m?  ? ?Wt Readings from Last 3 Encounters:  ?05/16/21 205 lb (93 kg)  ?01/15/21 218 lb 6.4 oz (99.1 kg)  ?11/13/20 220 lb 9.6 oz (100.1 kg)  ? ? ? ?General Appearance:    Alert, cooperative, no distress, appears stated age  ?Head:    Normocephalic, without obvious abnormality, atraumatic  ?Eyes:    PERRL, conjunctiva/corneas clear, EOM's intact, fundi  ?  benign  ?Ears:    Normal TM's and external  ear canals  ?Nose:   Nares normal, no drainage or sinus tenderness  ?Throat:   Lips, mucosa, and tongue normal; teeth and gums normal  ?Neck:   Supple, no lymphadenopathy;  thyroid:  no enlargement/ tenderness/nodules; no carotid bruit or JVD  ?Back:    Spine nontender, no curvature, ROM normal, no CVA     tenderness  ?Lungs:     Clear to auscultation bilaterally without wheezes, rales or     ronchi; respirations unlabored  ?Chest Wall:    No tenderness or deformity  ? Heart:    Regular rate and rhythm, S1 and S2 normal, no murmur, rub ?  or gallop  ?Breast Exam:    No tenderness, masses, or nipple discharge or inversion.  WHSS from reduction mammoplasty. There is a 0.5cmm mobile firm mass at 11 o'clock on R breast, just outside of areola, and a 1cm firm mass at 12- 1o'clock on L breast, ouside of areola. Pt states these have been present since her breast  reduction (markers used), and denies any change.  More fibrocystic/fibroglandular changes were noted in the LUOQ, nontender. No axillary lymphadenopathy  ?Abdomen:     Soft, non-tender, nondistended, normoactive bowel soun

## 2021-05-16 ENCOUNTER — Ambulatory Visit (INDEPENDENT_AMBULATORY_CARE_PROVIDER_SITE_OTHER): Payer: 59 | Admitting: Family Medicine

## 2021-05-16 ENCOUNTER — Encounter: Payer: Self-pay | Admitting: Family Medicine

## 2021-05-16 ENCOUNTER — Other Ambulatory Visit (HOSPITAL_COMMUNITY)
Admission: RE | Admit: 2021-05-16 | Discharge: 2021-05-16 | Disposition: A | Discharge status: Home / Self Care | Payer: 59 | Source: Ambulatory Visit | Attending: Family Medicine | Admitting: Family Medicine

## 2021-05-16 VITALS — BP 134/80 | HR 68 | Ht 62.5 in | Wt 205.0 lb

## 2021-05-16 DIAGNOSIS — Z0000 Encounter for general adult medical examination without abnormal findings: Secondary | ICD-10-CM

## 2021-05-16 DIAGNOSIS — I152 Hypertension secondary to endocrine disorders: Secondary | ICD-10-CM

## 2021-05-16 DIAGNOSIS — E785 Hyperlipidemia, unspecified: Secondary | ICD-10-CM

## 2021-05-16 DIAGNOSIS — E1169 Type 2 diabetes mellitus with other specified complication: Secondary | ICD-10-CM

## 2021-05-16 DIAGNOSIS — I10 Essential (primary) hypertension: Secondary | ICD-10-CM | POA: Diagnosis not present

## 2021-05-16 DIAGNOSIS — E1159 Type 2 diabetes mellitus with other circulatory complications: Secondary | ICD-10-CM

## 2021-05-16 LAB — POCT GLYCOSYLATED HEMOGLOBIN (HGB A1C): Hemoglobin A1C: 6.5 % — AB (ref 4.0–5.6)

## 2021-05-16 MED ORDER — AMLODIPINE BESYLATE 2.5 MG PO TABS: 2.5000 mg | ORAL_TABLET | Freq: Every day | ORAL | 1 refills | Status: DC

## 2021-05-16 MED ORDER — OZEMPIC (1 MG/DOSE) 4 MG/3ML ~~LOC~~ SOPN: PEN_INJECTOR | SUBCUTANEOUS | 5 refills | Status: DC

## 2021-05-16 MED ORDER — METFORMIN HCL ER 500 MG PO TB24: 500.0000 mg | ORAL_TABLET | Freq: Every day | ORAL | 0 refills | Status: DC

## 2021-05-18 LAB — CYTOLOGY - PAP
Comment: NEGATIVE
Diagnosis: NEGATIVE
High risk HPV: NEGATIVE

## 2021-06-07 ENCOUNTER — Encounter: Payer: Self-pay | Admitting: Family Medicine

## 2021-06-07 ENCOUNTER — Ambulatory Visit (INDEPENDENT_AMBULATORY_CARE_PROVIDER_SITE_OTHER): Payer: 59 | Admitting: Family Medicine

## 2021-06-07 VITALS — BP 138/88 | HR 76 | Temp 97.8°F | Ht 62.0 in | Wt 205.6 lb

## 2021-06-07 DIAGNOSIS — J302 Other seasonal allergic rhinitis: Secondary | ICD-10-CM | POA: Diagnosis not present

## 2021-06-07 DIAGNOSIS — H579 Unspecified disorder of eye and adnexa: Secondary | ICD-10-CM

## 2021-06-07 DIAGNOSIS — R6889 Other general symptoms and signs: Secondary | ICD-10-CM | POA: Diagnosis not present

## 2021-06-07 DIAGNOSIS — J3489 Other specified disorders of nose and nasal sinuses: Secondary | ICD-10-CM | POA: Diagnosis not present

## 2021-06-07 DIAGNOSIS — H1013 Acute atopic conjunctivitis, bilateral: Secondary | ICD-10-CM

## 2021-06-07 DIAGNOSIS — H109 Unspecified conjunctivitis: Secondary | ICD-10-CM

## 2021-06-07 DIAGNOSIS — J029 Acute pharyngitis, unspecified: Secondary | ICD-10-CM | POA: Diagnosis not present

## 2021-06-07 LAB — POC COVID19 BINAXNOW: SARS Coronavirus 2 Ag: NEGATIVE

## 2021-06-07 MED ORDER — POLYMYXIN B-TRIMETHOPRIM 10000-0.1 UNIT/ML-% OP SOLN: 1.0000 [drp] | OPHTHALMIC | 0 refills | Status: DC

## 2021-06-07 NOTE — Progress Notes (Signed)
Chief Complaint  ?Patient presents with  ? Eye Problem  ?  Itchy, red and watery eyes. Sinus pain and pressure. ST from drainage. Symptoms started Sunday. Has not done any covid tests.   ? ?Works for a daycare.  A couple of weeks ago, when outside with the kids she had itchy eyes, ears, sneezing, runny nose.  Sneezing stopped.   ?Sunday her right eye started itching a lot, was very watery, and then Monday spread to the right eye (both itchy and watery).  Both eyes have been crusted shut in the morning, and constantly wiping her eyes.  It is white, but "slimy". ? ?She has persistent runny nose.  Slight discomfort in her forehead, most discomfort is at the base of the nose. ?+sore throat, slight cough, productive of some brown phlegm. ? ?She just started taking claritin yesterday, thinks it has helped some. ? ?She also is complaining of a bump at her R shoulder/clavicle. Started 2 weeks ago, now smaller and less tender. ?No trauma or injury ? ?Doing well on Ozempic.  +weight loss.  Morning sugars are in the 110 range. ? ?PMH, PSH, SH reviewed ? ?Outpatient Encounter Medications as of 06/07/2021  ?Medication Sig Note  ? ACCU-CHEK SOFTCLIX LANCETS lancets  06/21/2014: Received from: External Pharmacy  ? amLODipine (NORVASC) 2.5 MG tablet Take 1 tablet (2.5 mg total) by mouth daily.   ? ASPIRIN 81 PO Take 1 tablet by mouth daily.   ? loratadine (CLARITIN) 10 MG tablet Take 10 mg by mouth daily.   ? metFORMIN (GLUCOPHAGE-XR) 500 MG 24 hr tablet Take 1 tablet (500 mg total) by mouth daily with breakfast.   ? Multiple Vitamins-Minerals (HAIR SKIN AND NAILS FORMULA PO) Take by mouth.   ? rosuvastatin (CRESTOR) 10 MG tablet Take 1 tablet (10 mg total) by mouth daily.   ? Semaglutide, 1 MG/DOSE, (OZEMPIC, 1 MG/DOSE,) 4 MG/3ML SOPN INJECT 1 MG ONCE A WEEK AS DIRECTED   ? trimethoprim-polymyxin b (POLYTRIM) ophthalmic solution Place 1-2 drops into both eyes every 4 (four) hours. Use ever 4-6 hours while awake, until resolved   ?  valsartan (DIOVAN) 160 MG tablet Take 1 tablet (160 mg total) by mouth daily.   ? acetaZOLAMIDE (DIAMOX) 500 MG capsule Take 500 mg by mouth 2 (two) times daily. (Patient not taking: Reported on 10/05/2020)   ? colchicine 0.6 MG tablet At the start of a gout flare, take 2 tablets. After an hour, take 1 more tablet. Do no repeat dosing for 48 hours. (Patient not taking: Reported on 01/15/2021)   ? ipratropium (ATROVENT) 0.06 % nasal spray Place 2 sprays into both nostrils 4 (four) times daily. (Patient not taking: Reported on 01/15/2021) 01/15/2021: Uses as needed  ? ondansetron (ZOFRAN ODT) 4 MG disintegrating tablet Take 1 tablet (4 mg total) by mouth every 8 (eight) hours as needed for nausea or vomiting. (Patient not taking: Reported on 10/05/2020)   ? SUMAtriptan (IMITREX) 100 MG tablet Take 1 tab at onset of headache. May repeat in 2 hours if needed. Do not take more than 2 tablets in 24 hours or 4 tablets in a week. (Patient not taking: Reported on 10/05/2020) 01/15/2021: Uses as needed for migraine  ? ?No facility-administered encounter medications on file as of 06/07/2021.  ? ?NOT using eye drops prior to visit ? ?Allergies  ?Allergen Reactions  ? Shrimp [Shellfish Allergy] Anaphylaxis  ? ?ROS:  URI symptoms per HPI. ?No f/c, n/v/d, rashes. ?Denies shortness of breath or chest pain. ?  No sick contacts. Last pinkeye exposure was a couple of weeks ago. ? ? ?PHYSICAL EXAM: ? ?BP 138/88   Pulse 76   Temp 97.8 ?F (36.6 ?C) (Tympanic)   Ht 5\' 2"  (1.575 m)   Wt 205 lb 9.6 oz (93.3 kg)   BMI 37.60 kg/m?  ? ?Wt Readings from Last 3 Encounters:  ?06/07/21 205 lb 9.6 oz (93.3 kg)  ?05/16/21 205 lb (93 kg)  ?01/15/21 218 lb 6.4 oz (99.1 kg)  ? ?Well-appearing female, in no distress ?HEENT: nasal mucosa--mod-severe edema, no erythema, clear mucus. ?OP with some PND posteriorly. Sinuses nontender ?Mild conjunctival injection bilaterally, watery eyes, no crusting or purulence noted. ?Neck: no lymphadenopathy ?Heart: regular  rate and rhythm ?Lungs: clear bialterally ?Firm knot at lateral R clavicle, near [facility] joint. ?Nontender, not mobile. ? ?Rapid COVID test negative ? ? ?ASSESSMENT/PLAN: ? ?Seasonal allergic rhinitis, unspecified trigger - Cont claritin. Start nasal steroid spray. Use of antihistamine eye drops as needed.  ? ?Allergic conjunctivitis of both eyes - reviewed allergy treatment options (oral, nasal and eye drops). Suspect allergic conjunctivitis, not bacterial at this point ? ?Itchy eyes - due to allergies - Plan: POC COVID-19 ? ?Sinus drainage - Plan: POC COVID-19 ? ?Sore throat - Plan: POC COVID-19 ? ?Conjunctivitis of both eyes, unspecified conjunctivitis type - suspect allergic. Reviewed s/sx of bacterial conjunct, and rx'd drops to use in case sx change - Plan: trimethoprim-polymyxin b (POLYTRIM) ophthalmic solution ? ?Lump on clavicle--initially thought (on exam only) was that it seemed like callous fr prior fx, as first thought before pt gave acute history. ?With hx, suspect poss trauma, vs ganglion (unusual location). Seems to be improving, so will just monitor for now.  F/u if persistent discomfort, swelling. ? ?I spent 35 minutes dedicated to the care of this patient, including pre-visit review of records, face to face time, post-visit ordering of testing and documentation. ? ?Drink plenty of water. ?Continue the claritin daily. ?You can use over-the-counter allergy drops for your eyes (Patanol/pataday, zaditor, or Naphcon-A, or others are fine). ?I recommend starting a nasal steroid spray such as Flonase.  This can take up to a week to see the full effect, but you can see it starting to work sooner. This will help with your congestion/sinus as well as with your eyes. ?Use 2 gentle sniffs into each nostril, once daily. ?Use this for the rest of allergy season.  If/when you start feeling better, you can potentially stop the claritin (or use it just when needed). ?Use mucinex or robitussin (guaifenesin), an  expectorant to help if you have a lot of thick mucus or phlegm, and coughing. ? ?Start the prescription eye drops only if your eye symptoms are worsening, rather than improving with the above treatment for allergies. ? ?

## 2021-06-07 NOTE — Patient Instructions (Signed)
?  Drink plenty of water. ?Continue the claritin daily. ?You can use over-the-counter allergy drops for your eyes (Patanol/pataday, zaditor, or Naphcon-A, or others are fine). ?I recommend starting a nasal steroid spray such as Flonase.  This can take up to a week to see the full effect, but you can see it starting to work sooner. This will help with your congestion/sinus as well as with your eyes. ?Use 2 gentle sniffs into each nostril, once daily. ?Use this for the rest of allergy season.  If/when you start feeling better, you can potentially stop the claritin (or use it just when needed). ?Use mucinex or robitussin (guaifenesin), an expectorant to help if you have a lot of thick mucus or phlegm, and coughing. ? ?Start the prescription eye drops only if your eye symptoms are worsening, rather than improving with the above treatment for allergies. ?

## 2021-09-18 ENCOUNTER — Ambulatory Visit: Payer: Commercial Managed Care - HMO | Admitting: Medical

## 2021-09-18 VITALS — HR 60 | Temp 98.6°F | Wt 206.2 lb

## 2021-09-18 DIAGNOSIS — M25511 Pain in right shoulder: Secondary | ICD-10-CM | POA: Diagnosis not present

## 2021-09-18 DIAGNOSIS — S8002XA Contusion of left knee, initial encounter: Secondary | ICD-10-CM

## 2021-09-18 DIAGNOSIS — R29898 Other symptoms and signs involving the musculoskeletal system: Secondary | ICD-10-CM | POA: Diagnosis not present

## 2021-09-18 DIAGNOSIS — W19XXXA Unspecified fall, initial encounter: Secondary | ICD-10-CM

## 2021-09-18 NOTE — Progress Notes (Addendum)
Subjective:  Crystal Mercado is a 56 y.o. female who presents for Chief Complaint  Patient presents with   Shoulder Pain    Shoulder pain, fell yesterday coming off a step stool and foot got caught. Having knee pain. Did get hurt on the job at work. Still wants to be seen for this visit as she has not reached out to management of company she works for about possible worker's comp. Is is aware she will have to pay for visit if denied     Here today for injury with pain of right shoulder and left knee.  She is right handed.  Initially at the start of the visit we discussed that the injury happened at work and she needs to notify her supervisor in case they need her to pursue other evaluation at a different office.  She works in a very Scientist, clinical (histocompatibility and immunogenetics) and she came in before she had a chance to even discuss with her Insurance claims handler.  Nevertheless she still wanted to be seen today  She notes that she was on a stepstool yesterday at work on the lower step of the stepstool so she was not very fall off the ground.  Her foot got caught as she was stepping off the stepstool.  She fell in a prone position landing on her arms, hands somewhat outstretched , and knees.  She denies head or neck injury, no neck pain.  No loss of consciousness.  She thinks she could have bumped her shoulder on something such as a cabinet as she was falling.  But she is not completely sure about this as it happened so quickly.  She has bruising of her right shoulder and left knee.  No swelling.  No numbness tingling or weakness.  She got back up quickly after falling and was able to complete her shift although she had pain.  She has some pain and trouble lifting her right shoulder today.  She has used some ice therapy and Tylenol.  No other aggravating or relieving factors.    No other c/o.  The following portions of the patient's history were reviewed and updated as appropriate: allergies, current medications, past family  history, past medical history, past social history, past surgical history and problem list.  ROS Otherwise as in subjective above     Objective: Pulse 60   Temp 98.6 F (37 C)   Wt 206 lb 3.2 oz (93.5 kg)   BMI 37.71 kg/m   General appearance: alert, no distress, well developed, well nourished Neck nontender with normal range of motion Clavicles and chest nontender with normal inspiration expiration Upper back nontender, back nontender in general Arms: She has about a 4 cm diameter area of reddish-purple bruising of her anterior right shoulder/biceps origin region, and she is tender in this area in general.  She has pain with shoulder range of motion in general although she has only slightly reduced internal and external shoulder range of motion on the right.  She has pain with active and passive range of motion of the shoulder in general, she has pain with resisted abduction, flexion of the shoulder and crossover test.  She has some pain with biceps contraction but not weak.  however she does seem to have some weakness on the right side with shoulder rotator cuff tests.  No other swelling or deformity.  Rest of arms nontender with normal range of motion Legs: She has a small 2 cm diameter purplish bruise of her medial inferior left patella  tender in the same area otherwise knee nontender without swelling or laxity, normal range of motion, right anterior lower leg midline midshaft with mild tenderness without bruising or deformity, otherwise bilateral legs nontender without obvious deformity Legs neurovascularly intact    Assessment: Encounter Diagnoses  Name Primary?   Fall, initial encounter Yes   Contusion of left knee, initial encounter    Acute pain of right shoulder    Shoulder weakness      Plan: We discussed her symptoms and mechanism of injury and exam findings.  We discussed possibly getting a right shoulder x-ray.  She declines at this point and is going to  communicate with her employer as a next step.   We also discussed if she does not get significant improvement in the course of the week she may need other imaging or referral to orthopedic or sports medicine regarding the right shoulder.  She voiced understanding and agreement with plan   We discussed the following which is also printed for her:   You appear to have a bruise or contusion of the left knee.  You have some bruising and pain of the right shoulder.  Since you think you may have hit something as you fell this could represent bruising or could even represent a more significant injury.  Is too early to tell at this point since it just happened yesterday.  Recommendations: I recommend you get an arm sling over-the-counter and use this on and off for the next several days to rest the shoulder You can use cool therapy/ice pack such as a bag of ice water or bag of frozen peas for both the shoulder and the knee.  Use cold therapy for 20 minutes at a time, 20 minutes on, 20 minutes off.  Make sure you have a cloth between the ice and your skin Short-term for the next 3 to 5 days use over-the-counter Aleve or ibuprofen twice daily for pain and inflammation Rest Avoid any significant lifting or right shoulder activity for the next several days to give the injury a chance to calm down If the shoulder is not significantly improved within the next week then you may end up needing to see orthopedic or sports medicine to further evaluate the shoulder injury    Crystal Mercado was seen today for shoulder pain.  Diagnoses and all orders for this visit:  Fall, initial encounter  Contusion of left knee, initial encounter  Acute pain of right shoulder  Shoulder weakness    Follow up: 1 wk

## 2021-09-18 NOTE — Patient Instructions (Signed)
Encounter Diagnoses  Name Primary?   Fall, initial encounter Yes   Contusion of left knee, initial encounter    Acute pain of right shoulder    Shoulder weakness    You appear to have a bruise or contusion of the left knee.  You have some bruising and pain of the right shoulder.  Since you think you may have hit something as you fell this could represent bruising or could even represent a more significant injury.  Is too early to tell at this point since it just happened yesterday.  Recommendations: I recommend you get an arm sling over-the-counter and use this on and off for the next several days to rest the shoulder You can use cool therapy/ice pack such as a bag of ice water or bag of frozen peas for both the shoulder and the knee.  Use cold therapy for 20 minutes at a time, 20 minutes on, 20 minutes off.  Make sure you have a cloth between the ice and your skin Short-term for the next 3 to 5 days use over-the-counter Aleve or ibuprofen twice daily for pain and inflammation Rest Avoid any significant lifting or right shoulder activity for the next several days to give the injury a chance to calm down If the shoulder is not significantly improved within the next week then you may end up needing to see orthopedic or sports medicine to further evaluate the shoulder injury

## 2021-10-10 ENCOUNTER — Encounter: Payer: Self-pay | Admitting: Internal Medicine

## 2021-10-16 ENCOUNTER — Other Ambulatory Visit: Payer: Self-pay | Admitting: Family Medicine

## 2021-10-16 ENCOUNTER — Telehealth: Payer: Self-pay | Admitting: Family Medicine

## 2021-10-16 DIAGNOSIS — I10 Essential (primary) hypertension: Secondary | ICD-10-CM

## 2021-10-16 MED ORDER — COLCHICINE 0.6 MG PO TABS: ORAL_TABLET | ORAL | 0 refills | Status: DC

## 2021-10-16 NOTE — Telephone Encounter (Signed)
RF sent.

## 2021-10-16 NOTE — Telephone Encounter (Signed)
Pt called and is requesting a refill on her colchicine 0.6 mg pt states she is having a gout flare up on her big toe, states she get a rx when she gets a flare out  Informed pt that you was out of the office today Pt uses  [id number] - [place], [place] - [address]

## 2021-11-07 ENCOUNTER — Telehealth: Payer: Self-pay | Admitting: *Deleted

## 2021-11-07 ENCOUNTER — Other Ambulatory Visit: Payer: Self-pay | Admitting: Family Medicine

## 2021-11-07 DIAGNOSIS — I10 Essential (primary) hypertension: Secondary | ICD-10-CM

## 2021-11-07 NOTE — Telephone Encounter (Signed)
Patient advised and she will go up to 2 a day.

## 2021-11-07 NOTE — Telephone Encounter (Signed)
We can discuss at her visit, since she has already been not taking the ozempic. Ensure that she is taking her metformin at least 2x/day (if her sugars are high, she can increase to three metformin daily).

## 2021-11-07 NOTE — Telephone Encounter (Signed)
Patient called, she was on Friday insurance and when it was switched to [place] they no longer cover her Ozempic. She called her insurance and they said with a PA the Ozempic would be covered at 50%. They do cover Bydureon, Byetta and Trulicity. She has an appt 11/19/21, should be wait until then to discuss?

## 2021-11-18 NOTE — Progress Notes (Unsigned)
No chief complaint on file.   Diabetes: She had been doing well on Ozempic 24m and metformin ER 5020mdaily (had nausea when taking it BID). Her A1c was 6.5% on this regimen at her physical in 05/2021. She was on Friday [place] (she called her insurance and they said with a PA the Ozempic would be covered at 50%). They do cover Bydureon, Byetta and Trulicity, and would like to discuss these meds today. She was advised to increase metformin to 2/d, while off the Ozempic.  Sugars are running  Last eye exam was over 2 years ago, denies any vision problems. She checks feet regularly, no numbness, tingling, lesions/concerns.    Gout--diagnosed with a flare in L great toe in 08/2020, treated with colchicine. This had occurred after a weekend with high alcohol intake. Since then, HCTZ was stopped. She had a gout flare in September. Lab Results  Component Value Date   LABURIC 5.8 01/15/2021     Hypertension:  Amlodipine 2.22m29mas add to her valsartan 160m122mily at her physical in 05/2021.  She denies side effects to medications.  Previously had also taken HCTZ, which was stopped due to gout.   She denies chest pain, palpitations, edema. She tries to limit sodium in her diet. Her wrist monitor was verified as accurate 11/2020. BP's are running   BP Readings from Last 3 Encounters:  06/07/21 138/88  05/16/21 134/80  01/15/21 136/86    Hyperlipidemia: She reports compliance with rosuvastatin 10mg73md denies side effects. She tries to follow a low cholesterol diet. Lipids okay on last check. Lab Results  Component Value Date   CHOL 176 01/15/2021   HDL 60 01/15/2021   LDLCALC 90 01/15/2021   TRIG 151 (H) 01/15/2021   CHOLHDL 2.9 01/15/2021    PMH, PSH, SH reviewed   ROS:  no fever, chill, URI symptoms, chest pain, shortness of breath. No n/v/d Allergies? Frontal HA? No bleeding, bruising, rash, urinary complaints. Moods  are good  ?shoulder pain? (Fall in August)   PHYSICAL EXAM:  There were no vitals taken for this visit.  Wt Readings from Last 3 Encounters:  09/18/21 206 lb 3.2 oz (93.5 kg)  06/07/21 205 lb 9.6 oz (93.3 kg)  05/16/21 205 lb (93 kg)   Well-appearing female, in no distress HEENT: PERRL, EOMI, conjunctiva and sclera are clear. Sinuses nontender Neck: no lymphadenopathy, or mass Heart: regular rate and rhythm Lungs: clear bilaterally Abdomen: soft, nontender, no organomegaly or mass Psych: normal mood, affect, hygiene and grooming Neuro: alert and oriented, cranial nerves grossly intact. Normal gait  ?? UPDATE Firm knot at lateral R clavicle, near AC [facility] Healtht. Nontender, not mobile.     ASSESSMENT/PLAN:  Has she had diabetic eye exam?  Scheduled?  ??out of metformin? Last filled #90 in April. She didn't mention being out when told to increase to 2/d when she called and told us [place]? Does she have plenty??? Valsartan and amlodipine RF needed (?did she run out of amlodipine? Given #30 9/12)  Flu and COVID shots  A1c  Uric acid, c-met, If fasting can do lipids  Start Bydureon or Trulicity (in place of Ozempic), if desired by pt.   F/u 6 mos CPE

## 2021-11-19 ENCOUNTER — Encounter: Payer: Self-pay | Admitting: Family Medicine

## 2021-11-19 ENCOUNTER — Ambulatory Visit: Payer: Commercial Managed Care - HMO | Admitting: Family Medicine

## 2021-11-19 VITALS — BP 134/86 | HR 76 | Ht 62.0 in | Wt 204.6 lb

## 2021-11-19 DIAGNOSIS — K429 Umbilical hernia without obstruction or gangrene: Secondary | ICD-10-CM

## 2021-11-19 DIAGNOSIS — M109 Gout, unspecified: Secondary | ICD-10-CM | POA: Diagnosis not present

## 2021-11-19 DIAGNOSIS — E785 Hyperlipidemia, unspecified: Secondary | ICD-10-CM

## 2021-11-19 DIAGNOSIS — I10 Essential (primary) hypertension: Secondary | ICD-10-CM

## 2021-11-19 DIAGNOSIS — E1159 Type 2 diabetes mellitus with other circulatory complications: Secondary | ICD-10-CM

## 2021-11-19 DIAGNOSIS — Z5181 Encounter for therapeutic drug level monitoring: Secondary | ICD-10-CM | POA: Diagnosis not present

## 2021-11-19 DIAGNOSIS — I152 Hypertension secondary to endocrine disorders: Secondary | ICD-10-CM

## 2021-11-19 DIAGNOSIS — E1169 Type 2 diabetes mellitus with other specified complication: Secondary | ICD-10-CM | POA: Diagnosis not present

## 2021-11-19 LAB — POCT GLYCOSYLATED HEMOGLOBIN (HGB A1C): Hemoglobin A1C: 6.9 % — AB (ref 4.0–5.6)

## 2021-11-19 MED ORDER — AMLODIPINE BESYLATE 5 MG PO TABS: 5.0000 mg | ORAL_TABLET | Freq: Every day | ORAL | 1 refills | Status: DC

## 2021-11-19 MED ORDER — METFORMIN HCL ER 500 MG PO TB24: 500.0000 mg | ORAL_TABLET | Freq: Two times a day (BID) | ORAL | 1 refills | Status: DC

## 2021-11-19 MED ORDER — TRULICITY 0.75 MG/0.5ML ~~LOC~~ SOAJ: 0.7500 mg | SUBCUTANEOUS | 0 refills | Status: DC

## 2021-11-19 MED ORDER — VALSARTAN 160 MG PO TABS: 160.0000 mg | ORAL_TABLET | Freq: Every day | ORAL | 3 refills | Status: DC

## 2021-11-19 MED ORDER — TRULICITY 1.5 MG/0.5ML ~~LOC~~ SOAJ: 1.5000 mg | SUBCUTANEOUS | 1 refills | Status: DC

## 2021-11-19 NOTE — Patient Instructions (Addendum)
Please schedule your diabetic eye exam and have them forward [place] a copy of their report.  Try lotrimin or lamisil for any itchy/flaky rash on your feet. Return for evaluation if not improving or spreading. Be sure to keep feet dry.  We discussed the medication allopurinol to lower uric acid levels and prevent gout flares. You opted to NOT start this medication, and to avoid alcohol entirely in order to prevent gout flares. If you change your mind, let us know.  Start Trulicity 1.30 mg injection once weekly, increase to the 1.5 mg in 4 weeks. Continue the metformin at 2/day, but cut back if you have any nausea or when sugars are better (<110 in the morning). Remember to eat small quantities at a time in order to prevent nausea (no overeating). Look at [email or web address] to see if there are any available coupons to help with cost.   Goal blood pressure is <130/80.  Since you are running higher than this, we are increasing the amlodipine dose to 5mg  daily.  Continue the valsartan as well. Continue to try and follow low sodium diet, get daily exercise and continue to work on weight loss.

## 2021-11-20 LAB — MICROALBUMIN / CREATININE URINE RATIO
Creatinine, Urine: 41.7 mg/dL
Microalb/Creat Ratio: 28 mg/g creat (ref 0–29)
Microalbumin, Urine: 11.5 ug/mL

## 2021-11-20 LAB — COMPREHENSIVE METABOLIC PANEL
ALT: 14 IU/L (ref 0–32)
AST: 18 IU/L (ref 0–40)
Albumin/Globulin Ratio: 1.3 (ref 1.2–2.2)
Albumin: 4.5 g/dL (ref 3.8–4.9)
Alkaline Phosphatase: 119 IU/L (ref 44–121)
BUN/Creatinine Ratio: 15 (ref 9–23)
BUN: 14 mg/dL (ref 6–24)
Bilirubin Total: 0.9 mg/dL (ref 0.0–1.2)
CO2: 25 mmol/L (ref 20–29)
Calcium: 9.8 mg/dL (ref 8.7–10.2)
Chloride: 104 mmol/L (ref 96–106)
Creatinine, Ser: 0.93 mg/dL (ref 0.57–1.00)
Globulin, Total: 3.6 g/dL (ref 1.5–4.5)
Glucose: 105 mg/dL — ABNORMAL HIGH (ref 70–99)
Potassium: 4.3 mmol/L (ref 3.5–5.2)
Sodium: 145 mmol/L — ABNORMAL HIGH (ref 134–144)
Total Protein: 8.1 g/dL (ref 6.0–8.5)
eGFR: 73 mL/min/{1.73_m2} (ref >59)

## 2021-11-20 LAB — CBC WITH DIFFERENTIAL/PLATELET
Basophils Absolute: 0 10*3/uL (ref 0.0–0.2)
Basos: 0 %
EOS (ABSOLUTE): 0.2 10*3/uL (ref 0.0–0.4)
Eos: 2 %
Hematocrit: 40.2 % (ref 34.0–46.6)
Hemoglobin: 13 g/dL (ref 11.1–15.9)
Immature Grans (Abs): 0 10*3/uL (ref 0.0–0.1)
Immature Granulocytes: 0 %
Lymphocytes Absolute: 2.7 10*3/uL (ref 0.7–3.1)
Lymphs: 23 %
MCH: 26.1 pg — ABNORMAL LOW (ref 26.6–33.0)
MCHC: 32.3 g/dL (ref 31.5–35.7)
MCV: 81 fL (ref 79–97)
Monocytes Absolute: 0.7 10*3/uL (ref 0.1–0.9)
Monocytes: 6 %
Neutrophils Absolute: 7.9 10*3/uL — ABNORMAL HIGH (ref 1.4–7.0)
Neutrophils: 69 %
Platelets: 251 10*3/uL (ref 150–450)
RBC: 4.99 x10E6/uL (ref 3.77–5.28)
RDW: 14.4 % (ref 11.7–15.4)
WBC: 11.5 10*3/uL — ABNORMAL HIGH (ref 3.4–10.8)

## 2021-11-20 LAB — LIPID PANEL
Chol/HDL Ratio: 2.5 ratio (ref 0.0–4.4)
Cholesterol, Total: 170 mg/dL (ref 100–199)
HDL: 68 mg/dL (ref >39)
LDL Chol Calc (NIH): 77 mg/dL (ref 0–99)
Triglycerides: 148 mg/dL (ref 0–149)
VLDL Cholesterol Cal: 25 mg/dL (ref 5–40)

## 2021-11-20 LAB — TSH: TSH: 1.81 u[IU]/mL (ref 0.450–4.500)

## 2021-11-20 LAB — URIC ACID: Uric Acid: 5 mg/dL (ref 3.0–7.2)

## 2021-11-20 MED ORDER — ROSUVASTATIN CALCIUM 10 MG PO TABS: 10.0000 mg | ORAL_TABLET | Freq: Every day | ORAL | 3 refills | Status: DC

## 2021-11-21 ENCOUNTER — Other Ambulatory Visit: Payer: Commercial Managed Care - HMO

## 2021-11-23 ENCOUNTER — Other Ambulatory Visit (INDEPENDENT_AMBULATORY_CARE_PROVIDER_SITE_OTHER): Payer: Commercial Managed Care - HMO

## 2021-11-23 DIAGNOSIS — Z23 Encounter for immunization: Secondary | ICD-10-CM

## 2021-12-07 ENCOUNTER — Other Ambulatory Visit (INDEPENDENT_AMBULATORY_CARE_PROVIDER_SITE_OTHER): Payer: Commercial Managed Care - HMO

## 2021-12-07 DIAGNOSIS — Z23 Encounter for immunization: Secondary | ICD-10-CM

## 2021-12-23 ENCOUNTER — Other Ambulatory Visit: Payer: Self-pay | Admitting: Family Medicine

## 2021-12-23 DIAGNOSIS — E1169 Type 2 diabetes mellitus with other specified complication: Secondary | ICD-10-CM

## 2022-02-15 ENCOUNTER — Telehealth: Payer: Self-pay | Admitting: Family Medicine

## 2022-02-20 ENCOUNTER — Encounter: Payer: Commercial Managed Care - HMO | Admitting: Family Medicine

## 2022-02-28 ENCOUNTER — Other Ambulatory Visit: Payer: Self-pay | Admitting: Family Medicine

## 2022-02-28 DIAGNOSIS — E1169 Type 2 diabetes mellitus with other specified complication: Secondary | ICD-10-CM

## 2022-03-01 ENCOUNTER — Encounter: Payer: Self-pay | Admitting: Family Medicine

## 2022-03-01 DIAGNOSIS — Z1231 Encounter for screening mammogram for malignant neoplasm of breast: Secondary | ICD-10-CM

## 2022-03-04 ENCOUNTER — Telehealth: Payer: Self-pay | Admitting: Family Medicine

## 2022-03-04 NOTE — Telephone Encounter (Signed)
Pt leave message that Rosuvastatin also requiring P.A.  Called pharmacy & formulary has changed Atorvastatin is preferred

## 2022-03-06 NOTE — Telephone Encounter (Signed)
Trulicity denied, called [facility] t# [phone], denied due to needing medical records with A1c & trial of Metformin, appeal faxed

## 2022-03-06 NOTE — Telephone Encounter (Signed)
P.A. ROSUVASTATIN

## 2022-03-09 NOTE — Telephone Encounter (Signed)
Appeal approved til 03/07/23, sent mychart message  

## 2022-03-16 NOTE — Telephone Encounter (Signed)
P.A. approved til 03/09/23, sent mychart message

## 2022-03-17 NOTE — Progress Notes (Unsigned)
No chief complaint on file.  Patient presents for follow up on chronic problems, including DM and HTN, with med changes made at her October visit.  Diabetes--When Ozempic was no longer covered by her insurance, she was switched to Trulicity (October). She took 0.76m weekly x 4 weeks, then increased to 1.568mdose. She is also taking metformin--twice daily (had been on it just once daily when also taking Ozempic; increased to 2/d when out of ozempic). Cut back when sugars impoved??? ? If gap in meds--needed prior auth, just recently approved. Sugars are running   Denies polydipsia, polyuria, hypoglycemia.     Last eye exam was over 2 years ago, denies any vision problems. Scheduled?? She checks feet regularly, no numbness, tingling, lesions/concerns.    Gout--diagnosed with a flare in L great toe in 08/2020, treated with colchicine. This had occurred after a weekend with high alcohol intake. Since then, HCTZ was stopped. She had a gout flare in September, in her toe, after celebrating friend's birthday (drinking a lot, shots, etc).  She had pain in her knee once after drinking 1 glass of wine 2 days in a row. She has never taken allopurinol.  She prefers to avoid all alcohol rather than take more meds, declined rx for allopurinol.  Further flares??   Lab Results  Component Value Date   LABURIC 5.0 11/19/2021      Hypertension:  Amlodipine dose was increased from 2.5 to 75m275mt her last visit in October.  She denies side effects. She also continues on Valsartan 160m60mHCTZ previously stopped due to gout). BP's are running  She denies chest pain, palpitations, edema, dizziness. Denies headaches (occ sinus/frontal). She tries to limit sodium intake in her diet. [place]?? ***  BP Readings from Last 3 Encounters:  11/19/21 134/86  06/07/21 138/88  05/16/21 134/80    Hyperlipidemia: She reports compliance with rosuvastatin 10mg375md denies side effects. She tries to follow a  low cholesterol diet.  (This also required a prior auth recently, went through). Lipids were at goal on last check: Lab Results  Component Value Date   CHOL 170 11/19/2021   HDL 68 11/19/2021   LDLCALC 77 11/19/2021   TRIG 148 11/19/2021   CHOLHDL 2.5 11/19/2021    PMH, PSH. SH reviewed      PHYSICAL EXAM:  There were no vitals taken for this visit.  Wt Readings from Last 3 Encounters:  11/19/21 204 lb 9.6 oz (92.8 kg)  09/18/21 206 lb 3.2 oz (93.5 kg)  06/07/21 205 lb 9.6 oz (93.3 kg)   Well-appearing female, in no distress HEENT: PERRL, EOMI, conjunctiva and sclera are clear.  Neck: no lymphadenopathy, or mass Heart: regular rate and rhythm Lungs: clear bilaterally Abdomen: soft, nontender, no organomegaly or mass. Small, nontender umbilical hernia noted (inferiorly on the right side), easily reducible. Psych: normal mood, affect, hygiene and grooming Neuro: alert and oriented, cranial nerves grossly intact. Normal gait Extremities: no edema, normal pulses.  Prominent portion of bone at distal R clavicle near AC [facility].  Nontender.   ASSESSMENT/PLAN:  A1c  Has she scheduled DM eye exam??   IT LOOKS LIKE YOU REFILLED THE 0.75 [id number] OF TRULICITY ON 1/25/Q000111QONLY SUPPOSED TO TAKE THAT FOR 4 WEEKS IN OCTOBER, THEN INCREASE TO THE 1.5MG DOSE--DID SHE REPORT A PROBLEM TOLERATING THE HIGHER DOSE??  Trulicity-- I SEE PRIOR AUTH WENT THROUGH 2/3, TOOK 3 WEEKS. (HOPING SHE WAS ABLE TO ACTUALLY START IN OCTOBER WHEN RX'D)[id number] MUCH OF  A GAP WAS THERE IN JANUARY, WHEN WE HAD TO GET PA??  ?RF trulicity at [id number] dose??   F/u as scheduled for CPE in April

## 2022-03-18 ENCOUNTER — Other Ambulatory Visit (HOSPITAL_BASED_OUTPATIENT_CLINIC_OR_DEPARTMENT_OTHER): Payer: Self-pay

## 2022-03-18 ENCOUNTER — Encounter (HOSPITAL_BASED_OUTPATIENT_CLINIC_OR_DEPARTMENT_OTHER): Payer: Self-pay

## 2022-03-18 ENCOUNTER — Encounter: Payer: Self-pay | Admitting: Family Medicine

## 2022-03-18 ENCOUNTER — Ambulatory Visit: Payer: 59 | Admitting: Family Medicine

## 2022-03-18 VITALS — BP 170/94 | HR 76 | Ht 62.0 in | Wt 209.8 lb

## 2022-03-18 DIAGNOSIS — E785 Hyperlipidemia, unspecified: Secondary | ICD-10-CM

## 2022-03-18 DIAGNOSIS — M25531 Pain in right wrist: Secondary | ICD-10-CM

## 2022-03-18 DIAGNOSIS — E1159 Type 2 diabetes mellitus with other circulatory complications: Secondary | ICD-10-CM

## 2022-03-18 DIAGNOSIS — E049 Nontoxic goiter, unspecified: Secondary | ICD-10-CM

## 2022-03-18 DIAGNOSIS — M65332 Trigger finger, left middle finger: Secondary | ICD-10-CM | POA: Diagnosis not present

## 2022-03-18 DIAGNOSIS — E1169 Type 2 diabetes mellitus with other specified complication: Secondary | ICD-10-CM

## 2022-03-18 DIAGNOSIS — I152 Hypertension secondary to endocrine disorders: Secondary | ICD-10-CM

## 2022-03-18 LAB — POCT GLYCOSYLATED HEMOGLOBIN (HGB A1C): Hemoglobin A1C: 8.2 % — AB (ref 4.0–5.6)

## 2022-03-18 MED ORDER — SEMAGLUTIDE (1 MG/DOSE) 4 MG/3ML ~~LOC~~ SOPN
1.0000 mg | PEN_INJECTOR | SUBCUTANEOUS | 2 refills | Status: DC
  Filled 2022-03-18: qty 3, 28d supply, fill #0
  Filled 2022-04-21: qty 3, 28d supply, fill #1
  Filled 2022-06-20 – 2022-07-12 (×2): qty 3, 28d supply, fill #2

## 2022-03-18 NOTE — Patient Instructions (Addendum)
  I put in a referral to the hand doctors for evaluation of your wrist pain and trigger finger. These may truly be caused by your work activities--speak to your supervisor about this before the hand appointment--you may be able to do this as a worker's comp issue, and not come out of your pocket for the deductible using your own insurance.  Increase your metformin to 2/day. We are going to try and switch you back to Ozempic, if possible. Monitor your sugars and blood pressure regularly, and record them on the sheet provided. Bring these sheets to your physical in April.  Your blood pressure was very high today. You should set up a blood pressure visit with the nurse and bring your monitor again--I want to make sure that your monitor is accurate and that your wonderful readings at home are truly valid. Cut back on sausage, [place] and other processed foods which are high in sodium.  Send me a photo of your blood pressure and sugar log in 2-4 weeks to let me know how things are going. (Through [place])

## 2022-03-22 ENCOUNTER — Telehealth: Payer: Self-pay | Admitting: Family Medicine

## 2022-03-22 NOTE — Telephone Encounter (Signed)
Ultrasound Appointment

## 2022-03-24 ENCOUNTER — Telehealth: Payer: Self-pay | Admitting: Family Medicine

## 2022-03-24 NOTE — Telephone Encounter (Unsigned)
P.A. OZEMPIC 

## 2022-03-25 NOTE — Telephone Encounter (Signed)
Medical records send in for P.A.

## 2022-03-27 ENCOUNTER — Ambulatory Visit (INDEPENDENT_AMBULATORY_CARE_PROVIDER_SITE_OTHER): Payer: 59 | Admitting: Orthopaedic Surgery

## 2022-03-27 DIAGNOSIS — M67431 Ganglion, right wrist: Secondary | ICD-10-CM | POA: Diagnosis not present

## 2022-03-27 DIAGNOSIS — M65332 Trigger finger, left middle finger: Secondary | ICD-10-CM | POA: Diagnosis not present

## 2022-03-27 MED ORDER — LIDOCAINE HCL 1 % IJ SOLN
0.3000 mL | INTRAMUSCULAR | Status: AC | PRN
  Administered 2022-03-27: .3 mL

## 2022-03-27 MED ORDER — BUPIVACAINE HCL 0.5 % IJ SOLN
0.3300 mL | INTRAMUSCULAR | Status: AC | PRN
  Administered 2022-03-27: .33 mL

## 2022-03-27 MED ORDER — METHYLPREDNISOLONE ACETATE 40 MG/ML IJ SUSP
13.3300 mg | INTRAMUSCULAR | Status: AC | PRN
  Administered 2022-03-27: 13.33 mg

## 2022-03-27 NOTE — Progress Notes (Signed)
Office Visit Note   Patient: Crystal Mercado           Date of Birth: 1965-12-13           MRN: [id number] Visit Date: 03/27/2022              Requested by: Rita Ohara, [address] [place],  [place] [id number] PCP: Rita Ohara, MD   Assessment & Plan: Visit Diagnoses:  1. Trigger finger, left middle finger   2. Ganglion cyst of volar aspect of right wrist     Plan: Impression is right wrist volar ganglion cyst and left long trigger finger.  In regards to the wrist, we have discussed aspiration and injection versus MRI for surgical planning.  She is currently not interested in either at this point.  She will follow-up with [place] for her ganglion cyst.  In regards to the trigger finger, she would like to proceed with trigger finger injection and night splint.  She will follow-up with [place] as needed.  Follow-Up Instructions: Return if symptoms worsen or fail to improve.   Orders:  No orders of the defined types were placed in this encounter.  No orders of the defined types were placed in this encounter.     Procedures: Hand/UE Inj: L long A1 for trigger finger on 03/27/2022 6:42 PM Indications: pain Details: 25 G needle Medications: 0.3 mL lidocaine 1 %; 0.33 mL bupivacaine 0.5 %; 13.33 mg methylPREDNISolone acetate 40 MG/ML Outcome: tolerated well, no immediate complications Consent was given by the patient. Patient was prepped and draped in the usual sterile fashion.       Clinical Data: No additional findings.   Subjective: Chief Complaint  Patient presents with   Left Hand - Pain   Right Wrist - Pain    HPI patient is a pleasant 57 year old right-hand-dominant female who comes in today with left middle trigger finger as well as a cyst to the right volar wrist.  Both issues have been ongoing since this past December.  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  Trigger finger symptoms  seem to be worse throughout the day.  No previous cortisone injection.  She thinks she may have had a trigger finger in the same finger but on the opposite hand which resolved on its own.  She is diabetic.  In regards to her wrist, she has not noticed increased in size since that began although she does note that the size fluctuates throughout the day.  She notes associated pain with twisting her wrist in certain ways.  She denies any fevers or chills.  No other ganglion cysts that she is aware of  Review of Systems as detailed in HPI.  All others reviewed and are negative.   Objective: Vital Signs: There were no vitals taken for this visit.  Physical Exam well-developed well-nourished female no acute distress.  Alert and oriented x 3.  Ortho Exam left hand exam shows markedly tender and palpable nodule at the A1 pulley of the long finger.  She does have reproducible triggering.  Right wrist exam shows moderate size cyst to the volar aspect of the distal radius.  This is mildly tender.  No skin changes.  No tenderness along the first dorsal compartment.  No tenderness to the first [facility] joint.  Negative Finkelstein.  She is neurovascular intact distally.  Specialty Comments:  No specialty comments available.  Imaging: No new imaging   PMFS History: Patient Active Problem List   Diagnosis Date Noted   Diabetes mellitus ([place]) 01/14/2021   Pseudotumor cerebri 06/21/2014   Headache 06/21/2014   Vertigo 06/21/2014   Visual disturbance 06/21/2014   Papilledema 06/21/2014   Genetic testing 03/14/2014   Family history of breast cancer    Family history of ovarian cancer    Past Medical History:  Diagnosis Date   Anemia    Diabetes mellitus without complication ([place])    Family history of breast cancer    Family history of ovarian cancer    Goiter    Headache    Hypertension    Pseudotumor cerebri 02/2019   Shortness of breath    with exercise - no meds- r/t weight per pt.   Vision  abnormalities     Family History  Problem Relation Age of Onset   Breast cancer Mother 34   Hypertension Father    CAD Father        died from massive MI in 17's   Breast cancer Sister 20   Diabetes Sister    Hypertension Sister    Diabetes Sister        prediabetes   Hypertension Sister    Thyroid disease Sister    Hypercholesterolemia Sister    Hypertension Brother    Hypertension Brother    Hypertension Brother    Stroke Brother 40   Stomach cancer Maternal Grandmother        unsure if this is the actual cancer   Ovarian cancer Maternal Aunt    CAD Maternal Aunt        died in her sleep at 33   Ovarian cancer Cousin        maternal first cousin dx in her 103s   Stroke Cousin        late 59's; 1st cousin   Prostate cancer Cousin    Pulmonary embolism Cousin     Past Surgical History:  Procedure Laterality Date   bilateral macromastia  09/2005   reduction   BREAST REDUCTION SURGERY     CESAREAN SECTION     x 3   CHOLECYSTECTOMY     REDUCTION MAMMAPLASTY Bilateral 2007   THYROID LOBECTOMY  03/2001    left side   TUBAL LIGATION     Social History   Occupational History   Not on file  Tobacco Use   Smoking status: Never   Smokeless tobacco: Never  Vaping Use   Vaping Use: Never used  Substance and Sexual Activity   Alcohol use: Not Currently    Comment: very little   Drug use: No   Sexual activity: Not Currently    Birth control/protection: Surgical

## 2022-03-28 ENCOUNTER — Other Ambulatory Visit (HOSPITAL_BASED_OUTPATIENT_CLINIC_OR_DEPARTMENT_OTHER): Payer: Self-pay

## 2022-03-28 NOTE — Telephone Encounter (Signed)
P.A. approved til 03/28/23 sent mychart message

## 2022-03-29 ENCOUNTER — Other Ambulatory Visit (HOSPITAL_BASED_OUTPATIENT_CLINIC_OR_DEPARTMENT_OTHER): Payer: Self-pay

## 2022-03-29 ENCOUNTER — Ambulatory Visit (HOSPITAL_BASED_OUTPATIENT_CLINIC_OR_DEPARTMENT_OTHER)
Admission: RE | Admit: 2022-03-29 | Discharge: 2022-03-29 | Disposition: A | Discharge status: Home / Self Care | Payer: 59 | Source: Ambulatory Visit | Attending: Family Medicine | Admitting: Family Medicine

## 2022-03-29 DIAGNOSIS — E049 Nontoxic goiter, unspecified: Secondary | ICD-10-CM | POA: Diagnosis not present

## 2022-04-07 ENCOUNTER — Other Ambulatory Visit: Payer: Self-pay | Admitting: Family Medicine

## 2022-04-07 DIAGNOSIS — E1169 Type 2 diabetes mellitus with other specified complication: Secondary | ICD-10-CM

## 2022-04-07 NOTE — Progress Notes (Signed)
P.A. done & approved

## 2022-04-11 LAB — HM DIABETES EYE EXAM

## 2022-04-25 ENCOUNTER — Other Ambulatory Visit: Payer: Self-pay | Admitting: Family Medicine

## 2022-04-25 DIAGNOSIS — E1169 Type 2 diabetes mellitus with other specified complication: Secondary | ICD-10-CM

## 2022-05-20 ENCOUNTER — Encounter: Payer: Commercial Managed Care - HMO | Admitting: Family Medicine

## 2022-05-29 ENCOUNTER — Encounter (HOSPITAL_BASED_OUTPATIENT_CLINIC_OR_DEPARTMENT_OTHER): Payer: Self-pay

## 2022-05-29 ENCOUNTER — Emergency Department (HOSPITAL_BASED_OUTPATIENT_CLINIC_OR_DEPARTMENT_OTHER): Payer: 59

## 2022-05-29 ENCOUNTER — Emergency Department (HOSPITAL_BASED_OUTPATIENT_CLINIC_OR_DEPARTMENT_OTHER)
Admission: EM | Admit: 2022-05-29 | Discharge: 2022-05-29 | Disposition: A | Discharge status: Home / Self Care | Payer: 59 | Attending: Emergency Medicine | Admitting: Emergency Medicine

## 2022-05-29 ENCOUNTER — Ambulatory Visit (HOSPITAL_COMMUNITY)
Admission: EM | Admit: 2022-05-29 | Discharge: 2022-05-29 | Disposition: A | Discharge status: Home / Self Care | Payer: 59

## 2022-05-29 ENCOUNTER — Encounter (HOSPITAL_COMMUNITY): Payer: Self-pay | Admitting: *Deleted

## 2022-05-29 ENCOUNTER — Other Ambulatory Visit: Payer: Self-pay

## 2022-05-29 DIAGNOSIS — Z7984 Long term (current) use of oral hypoglycemic drugs: Secondary | ICD-10-CM | POA: Diagnosis not present

## 2022-05-29 DIAGNOSIS — R519 Headache, unspecified: Secondary | ICD-10-CM

## 2022-05-29 DIAGNOSIS — R42 Dizziness and giddiness: Secondary | ICD-10-CM | POA: Insufficient documentation

## 2022-05-29 DIAGNOSIS — I10 Essential (primary) hypertension: Secondary | ICD-10-CM

## 2022-05-29 DIAGNOSIS — Z7985 Long-term (current) use of injectable non-insulin antidiabetic drugs: Secondary | ICD-10-CM | POA: Diagnosis not present

## 2022-05-29 DIAGNOSIS — Z79899 Other long term (current) drug therapy: Secondary | ICD-10-CM | POA: Insufficient documentation

## 2022-05-29 DIAGNOSIS — Z7982 Long term (current) use of aspirin: Secondary | ICD-10-CM | POA: Insufficient documentation

## 2022-05-29 DIAGNOSIS — H538 Other visual disturbances: Secondary | ICD-10-CM | POA: Insufficient documentation

## 2022-05-29 LAB — BASIC METABOLIC PANEL
Anion gap: 10 (ref 5–15)
BUN: 11 mg/dL (ref 6–20)
CO2: 28 mmol/L (ref 22–32)
Calcium: 9.7 mg/dL (ref 8.9–10.3)
Chloride: 104 mmol/L (ref 98–111)
Creatinine, Ser: 0.79 mg/dL (ref 0.44–1.00)
GFR, Estimated: >60 mL/min (ref >60)
Glucose, Bld: 90 mg/dL (ref 70–99)
Potassium: 3.6 mmol/L (ref 3.5–5.1)
Sodium: 142 mmol/L (ref 135–145)

## 2022-05-29 LAB — CBC
HCT: 42.5 % (ref 36.0–46.0)
Hemoglobin: 13.2 g/dL (ref 12.0–15.0)
MCH: 25.5 pg — ABNORMAL LOW (ref 26.0–34.0)
MCHC: 31.1 g/dL (ref 30.0–36.0)
MCV: 82.2 fL (ref 80.0–100.0)
Platelets: 224 10*3/uL (ref 150–400)
RBC: 5.17 MIL/uL — ABNORMAL HIGH (ref 3.87–5.11)
RDW: 15.5 % (ref 11.5–15.5)
WBC: 10.2 10*3/uL (ref 4.0–10.5)
nRBC: 0 % (ref 0.0–0.2)

## 2022-05-29 MED ORDER — ACETAZOLAMIDE ER 500 MG PO CP12: 500.0000 mg | ORAL_CAPSULE | Freq: Every day | ORAL | 0 refills | Status: DC

## 2022-05-29 MED ORDER — KETOROLAC TROMETHAMINE 15 MG/ML IJ SOLN: 15.0000 mg | Freq: Once | INTRAMUSCULAR | Status: DC

## 2022-05-29 MED ORDER — METOCLOPRAMIDE HCL 5 MG/ML IJ SOLN
10.0000 mg | Freq: Once | INTRAMUSCULAR | Status: AC
  Administered 2022-05-29: 10 mg via INTRAVENOUS
  Filled 2022-05-29: qty 2

## 2022-05-29 NOTE — ED Notes (Addendum)
Pt states that she is having "numbness" to the right side of her face. "Feels similar to when a dentist injects numbing meds"... Also has a headache and feeling lightheaded and dizzy, worsens upon standing... Started last night... NIH was a 0... Something similar happened a few years prior and they had to drain spinal fluid... Provider currently with Pt.Crystal KitchenMarland Mercado

## 2022-05-29 NOTE — ED Triage Notes (Addendum)
Patient here POV from UC.  Endorses at 1200 becoming Lightheaded at work and Faint. 145/101 BP at Work and took Amlodipine and Valsartan.   Went to UC and was sent for Evaluation. Currently endorses headache, Lightheadedness, Blurred Vision. Endorses a "discomfort" in chest that is not Painful.   NAD Noted during Triage. A&Ox4. Gcs 15. Ambulatory.

## 2022-05-29 NOTE — ED Provider Notes (Signed)
[place] EMERGENCY DEPARTMENT AT [facility] Provider Note   CSN: [id number] Arrival date & time: 05/29/22  1531     History  Chief Complaint  Patient presents with   Headache    Crystal Mercado is a 57 y.o. female.  Patient is a 57 year old female who presents with headache.  She states that her blood pressure was fine this morning.  While she was at work today, she felt lightheaded and had some blurry vision.  She has a pain to the left side of her head.  She checked her blood pressure and it was 145/101.  She went to an urgent care and was sent here for further evaluation.  She does state that she took an extra dose of her amlodipine and valsartan.  She denies any numbness or weakness to her extremities.  No double vision or blind spots.  No difficulty with her speech.  No recent head trauma.  She does have a prior history of pseudotumor cerebri.  She has had LPs in the past.  She was taking Diamox but says she is no longer taking that.  She says she stopped taking it when she ran out of it last.       Home Medications Prior to Admission medications   Medication Sig Start Date End Date Taking? Authorizing Provider  acetaZOLAMIDE ER (DIAMOX) 500 MG capsule Take 1 capsule (500 mg total) by mouth daily. 05/29/22  Yes Rolan Bucco, MD  amLODipine (NORVASC) 5 MG tablet Take 1 tablet (5 mg total) by mouth daily. 11/19/21   Joselyn Arrow, MD  ASPIRIN 81 PO Take 1 tablet by mouth daily.    [provider]  colchicine 0.6 MG tablet At the start of a gout flare, take 2 tablets. After an hour, take 1 more tablet. Do no repeat dosing for 48 hours. Patient not taking: Reported on 03/18/2022 10/16/21   Joselyn Arrow, MD  ipratropium (ATROVENT) 0.06 % nasal spray Place 2 sprays into both nostrils 4 (four) times daily. Patient not taking: Reported on 11/19/2021 06/24/16   Deatra Canter, FNP  loratadine (CLARITIN) 10 MG tablet Take 10 mg by mouth daily. Patient not taking:  Reported on 03/18/2022    [provider]  metFORMIN (GLUCOPHAGE-XR) 500 MG 24 hr tablet Take 1 tablet (500 mg total) by mouth 2 (two) times daily with a meal. 11/19/21   Joselyn Arrow, MD  ondansetron (ZOFRAN ODT) 4 MG disintegrating tablet Take 1 tablet (4 mg total) by mouth every 8 (eight) hours as needed for nausea or vomiting. Patient not taking: Reported on 11/19/2021 04/24/20   Mickie Bail, NP  rosuvastatin (CRESTOR) 10 MG tablet Take 1 tablet (10 mg total) by mouth daily. 11/20/21   Joselyn Arrow, MD  Semaglutide, 1 MG/DOSE, 4 MG/3ML SOPN Inject 1 mg as directed once a week. 03/18/22   Joselyn Arrow, MD  SUMAtriptan (IMITREX) 100 MG tablet Take 1 tab at onset of headache. May repeat in 2 hours if needed. Do not take more than 2 tablets in 24 hours or 4 tablets in a week. Patient not taking: Reported on 03/18/2022 03/03/19   Asa Lente, MD  valsartan (DIOVAN) 160 MG tablet Take 1 tablet (160 mg total) by mouth daily. 11/19/21   Joselyn Arrow, MD      Allergies    Shrimp [shellfish allergy]    Review of Systems   Review of Systems  Constitutional:  Positive for fatigue. Negative for chills, diaphoresis and fever.  HENT:  Negative for congestion, rhinorrhea and sneezing.   Eyes:  Positive for visual disturbance.  Respiratory:  Negative for cough, chest tightness and shortness of breath.   Cardiovascular:  Negative for chest pain and leg swelling.  Gastrointestinal:  Negative for abdominal pain, blood in stool, diarrhea, nausea and vomiting.  Genitourinary:  Negative for difficulty urinating, flank pain, frequency and hematuria.  Musculoskeletal:  Negative for arthralgias and back pain.  Skin:  Negative for rash.  Neurological:  Positive for light-headedness and headaches. Negative for speech difficulty, weakness and numbness.    Physical Exam Updated Vital Signs BP 137/87   Pulse 66   Temp (!) 97.5 F (36.4 C) (Temporal)   Resp 12   Ht 5\' 2"  (1.575 m)   Wt 95.2 kg   SpO2 97%    BMI 38.39 kg/m  Physical Exam Constitutional:      Appearance: She is well-developed.  HENT:     Head: Normocephalic and atraumatic.  Eyes:     Pupils: Pupils are equal, round, and reactive to light.  Cardiovascular:     Rate and Rhythm: Normal rate and regular rhythm.     Heart sounds: Normal heart sounds.  Pulmonary:     Effort: Pulmonary effort is normal. No respiratory distress.     Breath sounds: Normal breath sounds. No wheezing or rales.  Chest:     Chest wall: No tenderness.  Abdominal:     General: Bowel sounds are normal.     Palpations: Abdomen is soft.     Tenderness: There is no abdominal tenderness. There is no guarding or rebound.  Musculoskeletal:        General: Normal range of motion.     Cervical back: Normal range of motion and neck supple.  Lymphadenopathy:     Cervical: No cervical adenopathy.  Skin:    General: Skin is warm and dry.     Findings: No rash.  Neurological:     Mental Status: She is alert and oriented to person, place, and time.     Comments: Motor 5/5 all extremities Sensation grossly intact to LT all extremities  no pronator drift CN II-XII grossly intact       ED Results / Procedures / Treatments   Labs (all labs ordered are listed, but only abnormal results are displayed) Labs Reviewed  CBC - Abnormal; Notable for the following components:      Result Value   RBC 5.17 (*)    MCH 25.5 (*)    All other components within normal limits  BASIC METABOLIC PANEL    EKG EKG Interpretation  Date/Time:  Wednesday May 29 2022 15:55:06 EDT Ventricular Rate:  76 PR Interval:  199 QRS Duration: 116 QT Interval:  436 QTC Calculation: 491 R Axis:   -28 Text Interpretation: Sinus rhythm Nonspecific intraventricular conduction delay Probable anteroseptal infarct, old since last tracing no significant change Confirmed by Rolan Bucco [phone]) on 05/29/2022 4:10:34 PM  Radiology CT Head Wo Contrast  Result Date:  05/29/2022 CLINICAL DATA:  New onset headache, blurred vision EXAM: CT HEAD WITHOUT CONTRAST TECHNIQUE: Contiguous axial images were obtained from the base of the skull through the vertex without intravenous contrast. RADIATION DOSE REDUCTION: This exam was performed according to the departmental dose-optimization program which includes automated exposure control, adjustment of the mA and/or kV according to patient size and/or use of iterative reconstruction technique. COMPARISON:  07/01/2014 FINDINGS: Brain: No evidence of acute infarction, hemorrhage, hydrocephalus, extra-axial collection or mass lesion/mass  effect. Periventricular white matter hypodensity. Small lacunar infarction of the left caudate (series 2, image 16). Vascular: No hyperdense vessel or unexpected calcification. Skull: Normal. Negative for fracture or focal lesion. Sinuses/Orbits: No acute finding. Other: None. IMPRESSION: 1. No acute intracranial pathology. Small-vessel white matter disease. 2.  Small lacunar infarction of the left caudate. Electronically Signed   By: Jearld Lesch M.D.   On: 05/29/2022 17:18    Procedures Procedures    Medications Ordered in ED Medications  metoCLOPramide (REGLAN) injection 10 mg (10 mg Intravenous Given 05/29/22 1637)    ED Course/ Medical Decision Making/ A&P                             Medical Decision Making Amount and/or Complexity of Data Reviewed Labs: ordered. Radiology: ordered.  Risk Prescription drug management.   Patient is a 57 year old female who presents with a headache.  She also has some blurry vision and lightheadedness.  She has had similar headaches before.  It is unclear if it was related to her pseudotumor cerebri or other migrainous type headaches.  She does not have any neck pain or other suggestions of subarachnoid hemorrhage or meningitis.  No fever.  No focal neurologic deficits or concerns for stroke.  She did have a head CT which shows no increase in the  size of her ventricles.  No mass.  No other acute abnormality.  Labs reviewed and are nonconcerning.  I reviewed her old records regarding her neurologic notes.  She was given a dose of Reglan in the ED and her symptoms resolved.  Her headache is almost completely gone.  Her blurry vision has resolved.  Given the resolution of symptoms, I do not think that patient needs an LP at this point.  I did discuss getting her back on her Diamox.  She says she has been out of it since she ran out recently.  Will start her back on the Diamox and encouraged her to follow-up with [facility] neurolog[facility].  Return precautions were given.  Final Clinical Impression(s) / ED Diagnoses Final diagnoses:  Acute nonintractable headache, unspecified headache type    Rx / DC Orders ED Discharge Orders          Ordered    acetaZOLAMIDE ER (DIAMOX) 500 MG capsule  Daily        05/29/22 1848              Rolan Bucco, MD 05/29/22 1851

## 2022-05-29 NOTE — ED Triage Notes (Addendum)
Pt reports she checked her BP at work because she felt light headed and felt like she was going to [employer] out. BP 145/101 when checked at work . Pt reports she has blurry vision now and still feels light headed. BP during triage 150/95.PT reports she takes her BP meds at night but took the meds at 1300 because of BP. Pt took Amlodipine 5 mg and Valsartan 160 mg .

## 2022-05-29 NOTE — Discharge Instructions (Addendum)
As discussed, I feel like you need more workup of your high blood pressure and headache than I am able to offer here in urgent care.  Please have your son take you to the emergency department for further evaluation.

## 2022-05-29 NOTE — ED Provider Notes (Signed)
[facility]    CSN: [id number] Arrival date & time: 05/29/22  1316      History   Chief Complaint Chief Complaint  Patient presents with   Hypertension    HPI Crystal Mercado is a 57 y.o. female with history of diabetes, hypertension presents urgent care today with complaints of elevated blood pressure, lightheadedness, blurred vision and headache.  Patient reports BP was normal this morning with SBP 128.  She began feeling unwell this afternoon at work and checked her blood pressure and found it to be 145/101.  Patient reports taking extra doses of her amlodipine and valsartan at that time.  BP upon my assessment 138/90 but still with symptoms of blurred vision, lightheadedness and headache.  Patient states similar symptoms with previous pseudotumor cerebri several years ago.  Patient denies any recent illness, fever, chills, chest pain, shortness of breath, lower extremity swelling.  She denies any recent new or changed medications.   Past Medical History:  Diagnosis Date   Anemia    Diabetes mellitus without complication    Family history of breast cancer    Family history of ovarian cancer    Goiter    Headache    Hypertension    Pseudotumor cerebri 02/2019   Shortness of breath    with exercise - no meds- r/t weight per pt.   Vision abnormalities     Patient Active Problem List   Diagnosis Date Noted   Diabetes mellitus 01/14/2021   Pseudotumor cerebri 06/21/2014   Headache 06/21/2014   Vertigo 06/21/2014   Visual disturbance 06/21/2014   Papilledema 06/21/2014   Genetic testing 03/14/2014   Family history of breast cancer    Family history of ovarian cancer     Past Surgical History:  Procedure Laterality Date   bilateral macromastia  09/2005   reduction   BREAST REDUCTION SURGERY     CESAREAN SECTION     x 3   CHOLECYSTECTOMY     REDUCTION MAMMAPLASTY Bilateral 2007   THYROID LOBECTOMY  03/2001    left side   TUBAL LIGATION      OB History    No obstetric history on file.      Home Medications    Prior to Admission medications   Medication Sig Start Date End Date Taking? Authorizing Provider  amLODipine (NORVASC) 5 MG tablet Take 1 tablet (5 mg total) by mouth daily. 11/19/21   Joselyn Arrow, MD  ASPIRIN 81 PO Take 1 tablet by mouth daily.    [provider]  colchicine 0.6 MG tablet At the start of a gout flare, take 2 tablets. After an hour, take 1 more tablet. Do no repeat dosing for 48 hours. Patient not taking: Reported on 03/18/2022 10/16/21   Joselyn Arrow, MD  ipratropium (ATROVENT) 0.06 % nasal spray Place 2 sprays into both nostrils 4 (four) times daily. Patient not taking: Reported on 11/19/2021 06/24/16   Deatra Canter, FNP  loratadine (CLARITIN) 10 MG tablet Take 10 mg by mouth daily. Patient not taking: Reported on 03/18/2022    [provider]  metFORMIN (GLUCOPHAGE-XR) 500 MG 24 hr tablet Take 1 tablet (500 mg total) by mouth 2 (two) times daily with a meal. 11/19/21   Joselyn Arrow, MD  ondansetron (ZOFRAN ODT) 4 MG disintegrating tablet Take 1 tablet (4 mg total) by mouth every 8 (eight) hours as needed for nausea or vomiting. Patient not taking: Reported on 11/19/2021 04/24/20   Mickie Bail, NP  rosuvastatin (CRESTOR) 10 MG tablet Take 1 tablet (10 mg total) by mouth daily. 11/20/21   Joselyn Arrow, MD  Semaglutide, 1 MG/DOSE, 4 MG/3ML SOPN Inject 1 mg as directed once a week. 03/18/22   Joselyn Arrow, MD  SUMAtriptan (IMITREX) 100 MG tablet Take 1 tab at onset of headache. May repeat in 2 hours if needed. Do not take more than 2 tablets in 24 hours or 4 tablets in a week. Patient not taking: Reported on 03/18/2022 03/03/19   Asa Lente, MD  valsartan (DIOVAN) 160 MG tablet Take 1 tablet (160 mg total) by mouth daily. 11/19/21   Joselyn Arrow, MD    Family History Family History  Problem Relation Age of Onset   Breast cancer Mother 67   Hypertension Father    CAD Father        died from massive  MI in 60's   Breast cancer Sister 33   Diabetes Sister    Hypertension Sister    Diabetes Sister        prediabetes   Hypertension Sister    Thyroid disease Sister    Hypercholesterolemia Sister    Hypertension Brother    Hypertension Brother    Hypertension Brother    Stroke Brother 60   Stomach cancer Maternal Grandmother        unsure if this is the actual cancer   Ovarian cancer Maternal Aunt    CAD Maternal Aunt        died in her sleep at 31   Ovarian cancer Cousin        maternal first cousin dx in her 72s   Stroke Cousin        late 65's; 1st cousin   Prostate cancer Cousin    Pulmonary embolism Cousin     Social History Social History   Tobacco Use   Smoking status: Never   Smokeless tobacco: Never  Vaping Use   Vaping Use: Never used  Substance Use Topics   Alcohol use: Not Currently    Comment: very little   Drug use: No     Allergies   Shrimp [shellfish allergy]   Review of Systems As stated in HPI otherwise negative   Physical Exam Triage Vital Signs ED Triage Vitals  Enc Vitals Group     BP 05/29/22 1443 (!) 150/95     Pulse Rate 05/29/22 1443 83     Resp 05/29/22 1443 20     Temp 05/29/22 1443 98.6 F (37 C)     Temp src --      SpO2 05/29/22 1443 98 %     Weight --      Height --      Head Circumference --      Peak Flow --      Pain Score 05/29/22 1448 6     Pain Loc --      Pain Edu? --      Excl. in GC? --    No data found.  Updated Vital Signs BP (!) 150/95   Pulse 83   Temp 98.6 F (37 C)   Resp 20   SpO2 98%   Visual Acuity Right Eye Distance:   Left Eye Distance:   Bilateral Distance:    Right Eye Near:   Left Eye Near:    Bilateral Near:     Physical Exam Constitutional:      General: She is not in acute distress.    Appearance: Normal appearance.  She is not ill-appearing or toxic-appearing.  Eyes:     Extraocular Movements: Extraocular movements intact.     Pupils: Pupils are equal, round, and  reactive to light.  Cardiovascular:     Rate and Rhythm: Normal rate and regular rhythm.     Heart sounds: No murmur heard.    No friction rub. No gallop.  Pulmonary:     Effort: Pulmonary effort is normal.     Breath sounds: Normal breath sounds. No wheezing, rhonchi or rales.  Abdominal:     General: Bowel sounds are normal.     Palpations: Abdomen is soft.  Musculoskeletal:        General: Normal range of motion.     Cervical back: Normal range of motion and neck supple. No rigidity.  Lymphadenopathy:     Cervical: No cervical adenopathy.  Skin:    General: Skin is warm and dry.  Neurological:     General: No focal deficit present.     Mental Status: She is alert and oriented to person, place, and time.     Cranial Nerves: No cranial nerve deficit.     Sensory: No sensory deficit.     Motor: No weakness.     Gait: Gait normal.  Psychiatric:        Mood and Affect: Mood normal.        Behavior: Behavior normal.      UC Treatments / Results  Labs (all labs ordered are listed, but only abnormal results are displayed) Labs Reviewed - No data to display  EKG   Radiology No results found.  Procedures Procedures (including critical care time)  Medications Ordered in UC Medications - No data to display  Initial Impression / Assessment and Plan / UC Course  I have reviewed the triage vital signs and the nursing notes.  Pertinent labs & imaging results that were available during my care of the patient were reviewed by me and considered in my medical decision making (see chart for details).  Hypertension, uncontrolled -With associated lightheadedness, blurred vision and headache.  Patient relates symptoms similar to pseudo cerebral tumor in the past.  Discussion had with patient and son that although blood pressure is not dangerously high at time of my visit, there is concern she continues to have associated symptoms.  Option to try to treat headache here at urgent care  to see if any response versus ED for further evaluation.  Patient and son agreeable go directly to ED directly from UC.  Patient felt stable for transport via private vehicle.  Reviewed expections re: course of current medical issues. Questions answered. Outlined signs and symptoms indicating need for more acute intervention. Pt verbalized understanding. AVS given  Final Clinical Impressions(s) / UC Diagnoses   Final diagnoses:  Hypertension, unspecified type  Nonintractable headache, unspecified chronicity pattern, unspecified headache type     Discharge Instructions      As discussed, I feel like you need more workup of your high blood pressure and headache than I am able to offer here in urgent care.  Please have your son take you to the emergency department for further evaluation.     ED Prescriptions   None    PDMP not reviewed this encounter.   Rolla Etienne, NP 05/29/22 [phone]

## 2022-05-29 NOTE — ED Notes (Signed)
Discharge paperwork given and verbally understood. 

## 2022-06-20 ENCOUNTER — Encounter: Payer: Self-pay | Admitting: *Deleted

## 2022-06-21 ENCOUNTER — Encounter: Payer: Self-pay | Admitting: Family Medicine

## 2022-06-24 ENCOUNTER — Encounter: Payer: Commercial Managed Care - HMO | Admitting: Family Medicine

## 2022-06-24 ENCOUNTER — Other Ambulatory Visit: Payer: Self-pay | Admitting: Family Medicine

## 2022-06-24 DIAGNOSIS — E1169 Type 2 diabetes mellitus with other specified complication: Secondary | ICD-10-CM

## 2022-06-24 DIAGNOSIS — I10 Essential (primary) hypertension: Secondary | ICD-10-CM

## 2022-06-26 ENCOUNTER — Encounter: Payer: Commercial Managed Care - HMO | Admitting: Family Medicine

## 2022-07-03 ENCOUNTER — Other Ambulatory Visit (HOSPITAL_BASED_OUTPATIENT_CLINIC_OR_DEPARTMENT_OTHER): Payer: Self-pay

## 2022-07-12 ENCOUNTER — Other Ambulatory Visit (HOSPITAL_BASED_OUTPATIENT_CLINIC_OR_DEPARTMENT_OTHER): Payer: Self-pay

## 2022-07-13 ENCOUNTER — Other Ambulatory Visit (HOSPITAL_BASED_OUTPATIENT_CLINIC_OR_DEPARTMENT_OTHER): Payer: Self-pay

## 2022-07-28 ENCOUNTER — Other Ambulatory Visit: Payer: Self-pay | Admitting: Family Medicine

## 2022-07-28 DIAGNOSIS — E1169 Type 2 diabetes mellitus with other specified complication: Secondary | ICD-10-CM

## 2022-07-29 ENCOUNTER — Other Ambulatory Visit (HOSPITAL_BASED_OUTPATIENT_CLINIC_OR_DEPARTMENT_OTHER): Payer: Self-pay

## 2022-07-29 ENCOUNTER — Other Ambulatory Visit: Payer: Self-pay | Admitting: Family Medicine

## 2022-07-29 ENCOUNTER — Other Ambulatory Visit: Payer: Self-pay | Admitting: *Deleted

## 2022-07-29 DIAGNOSIS — E1169 Type 2 diabetes mellitus with other specified complication: Secondary | ICD-10-CM

## 2022-07-29 DIAGNOSIS — I10 Essential (primary) hypertension: Secondary | ICD-10-CM

## 2022-07-29 MED ORDER — SEMAGLUTIDE (1 MG/DOSE) 4 MG/3ML ~~LOC~~ SOPN
1.0000 mg | PEN_INJECTOR | SUBCUTANEOUS | 0 refills | Status: DC
Start: 2022-07-29 — End: 2022-09-12
  Filled 2022-07-29 – 2022-08-10 (×2): qty 3, 28d supply, fill #0

## 2022-08-10 ENCOUNTER — Other Ambulatory Visit (HOSPITAL_BASED_OUTPATIENT_CLINIC_OR_DEPARTMENT_OTHER): Payer: Self-pay

## 2022-08-29 ENCOUNTER — Other Ambulatory Visit: Payer: Self-pay | Admitting: Family Medicine

## 2022-08-29 DIAGNOSIS — I10 Essential (primary) hypertension: Secondary | ICD-10-CM

## 2022-08-29 DIAGNOSIS — E1169 Type 2 diabetes mellitus with other specified complication: Secondary | ICD-10-CM

## 2022-09-15 NOTE — Patient Instructions (Incomplete)
   Please get your flu shot and COVID booster in the Fall (when the updated one becomes available).

## 2022-09-15 NOTE — Progress Notes (Unsigned)
No chief complaint on file.  Patient presents for follow up on chronic problems, including DM and HTN. She has not been seen since 03/2022 (cancelled her physical in 06/2022).Marland Kitchen  She was referred to Dr. Roda Shutters and was seen for L middle trigger finger and R wrist volar ganglion cyst. She had the trigger finger injected, preferred to wait on treating the ganglion cyst.  Had an ER visit in April, with headache and elevated BP. She was restarted on Diamox, as she has h/o pseudotumor cerebri, and she was encouraged to f/u with neurologist.  She has not scheduled this. NEED REFERRAL??? CT of the head showed: IMPRESSION: 1. No acute intracranial pathology. Small-vessel white matter disease. 2.  Small lacunar infarction of the left caudate.   Diabetes--When Ozempic was no longer covered by her insurance, she was switched to Trulicity (October). She took 0.75mg  weekly x 4 weeks, then was supposed to increase to 1.5mg  dose, but that dose wasn't available from the pharmacy.  She has stayed at the 0.75mg  dose,along with metformin, which she cut back to taking only 1 daily after starting Trulicity (had been taking it twice daily once she was out of Ozempic).  In January she had new insurance, had to get prior [place]. She ended up being off the Trulicity for the entire month of January, during which time she was taking 2 metformin daily. Once she restarted Trulicity (0.75 dose) she went back down to 1/d. A1c was up to 8.2% in 03/2022. We were able to get Ozempic covered, so she was switched from Trulicity to Ozempic. She is currently taking 1mg  weekly. She is taking metformin (ONCE OR TWICE DAILY???***)  Sugars are running  Denies polydipsia, polyuria, hypoglycemia. Last eye exam was over 2 years ago, denies any vision problems. She had reported at her last visit that she had appointment scheduled for 04/11/22, no report received. DID SHE GO???  She checks feet regularly, no numbness, tingling,  lesions/concerns.  Hypertension:  Amlodipine dose was increased from 2.5 to 5mg  at her visit in October, in addition to continuing Valsartan 160mg . (HCTZ previously stopped due to gout). At her February visit, she reported normal BP's at home, with a monitor that had been verified as accurate in 2022.   She was asked to monitor her BP closely and send [place] the values within a few weeks of her last visit, but she did not.  She ended up in the ER in April, as reported above, with elevated BP and headache.  She denies chest pain, palpitations, edema, dizziness, Headaches??  She tries to limit sodium intake in her diet. Has some processed foods occasionally (sausage, [place]).  BP's are running  BP Readings from Last 3 Encounters:  05/29/22 137/87  05/29/22 (!) 150/95  03/19/22 (!) 170/94    Hyperlipidemia: She reports compliance with rosuvastatin 10mg , and denies side effects. She tries to follow a low cholesterol diet.  Lipids were at goal on last check.  Lab Results  Component Value Date   CHOL 170 11/19/2021   HDL 68 11/19/2021   LDLCALC 77 11/19/2021   TRIG 148 11/19/2021   CHOLHDL 2.5 11/19/2021    Thyroid nodule was noted at her 03/2022 visit. She was sent for [place] which showed: IMPRESSION: Nodule 1 (TI-RADS 3), measuring 1.9 x 1.4 x 1.0 cm, located in the isthmus, meets criteria for imaging follow-up in 1 year. Annual ultrasound surveillance is recommended until 5 years of stability is documented.  She isn't aware of any changes to  the size/appearance of her thyroid.  Gout--diagnosed with a flare in L great toe in 08/2020, treated with colchicine. This had occurred after a weekend with high alcohol intake. Since then, HCTZ was stopped. She had a gout flare in September 2023, in her toe, after celebrating friend's birthday (drinking a lot, shots, etc).  She has never taken allopurinol.  She prefers to avoid all alcohol rather than take more meds, declined rx for  allopurinol. She hasn't had any further joint pain flares since 10/2021. She hasn't  had any alcohol.  Lab Results  Component Value Date   LABURIC 5.0 11/19/2021     PMH, PSH. SH reviewed    ROS: no fever, chills, URI symptoms or allergies. No n/v/d, heartburn, edema, chest pain, palpitations, shortness of breath. No bleeding, bruising, abdominal pain. Moods are good. Exercise is on the job only (active, 2 jobs, one in a warehouse). L middle finger trigger finger--?resolved s/p injection Ganglion wrist Headaches? Neck pain? Blurred vision, tinnitus, dizziness, diplopia, nausea or vomiting??   PHYSICAL EXAM:  There were no vitals taken for this visit.  Wt Readings from Last 3 Encounters:  05/29/22 209 lb 14.1 oz (95.2 kg)  03/18/22 209 lb 12.8 oz (95.2 kg)  11/19/21 204 lb 9.6 oz (92.8 kg)   Well-appearing female, in no distress HEENT: PERRL, EOMI, conjunctiva and sclera are clear.  Neck: no lymphadenopathy, or mass. Noted to have enlarged thyroid, slightly nodular, on the right. Heart: regular rate and rhythm Lungs: clear bilaterally Abdomen: soft, nontender, no organomegaly or mass.  Psych: normal mood, affect, hygiene and grooming Neuro: alert and oriented, cranial nerves grossly intact. Normal gait Extremities: no edema, normal pulses. She is slightly tender over the 3rd flexor tendon in palm. +mild triggering noted (didn't get stuck, could feel the pop). Soft tissue swelling that is tender at the right wrist.  UPDATE THYROID--NODULE AT INF ISTHMUS and RIGHT  Lab Results  Component Value Date   HGBA1C 8.2 (A) 03/18/2022    ASSESSMENT/PLAN:  POC A1c Did she ever get diabetic eye exam done? If so, please get report. If not, remind to schedule  Taking ozempic or ran out? Taking metformin 1 or 2 daily? (Should be 2) Should be bringing list of BP's and sugars to visit Is she taking diamox? (Did she ever take what was prescribed from ER in April). Was supposed  to f/u with neurologist--did she?  Canceled CPE in 06/2022, never r/s. Needs to schedule CPE  Refer to neuro??? Thyroid ultrasound due for repeat in 03/2023  Please get your flu shot and COVID booster in the Fall (when the updated one becomes available).

## 2022-09-16 ENCOUNTER — Ambulatory Visit: Payer: 59 | Admitting: Family Medicine

## 2022-09-16 ENCOUNTER — Encounter: Payer: Self-pay | Admitting: Family Medicine

## 2022-09-16 ENCOUNTER — Other Ambulatory Visit (HOSPITAL_BASED_OUTPATIENT_CLINIC_OR_DEPARTMENT_OTHER): Payer: Self-pay

## 2022-09-16 VITALS — BP 122/74 | HR 75 | Resp 14 | Ht 63.0 in | Wt 199.4 lb

## 2022-09-16 DIAGNOSIS — Z1211 Encounter for screening for malignant neoplasm of colon: Secondary | ICD-10-CM

## 2022-09-16 DIAGNOSIS — E1159 Type 2 diabetes mellitus with other circulatory complications: Secondary | ICD-10-CM

## 2022-09-16 DIAGNOSIS — E1169 Type 2 diabetes mellitus with other specified complication: Secondary | ICD-10-CM | POA: Diagnosis not present

## 2022-09-16 DIAGNOSIS — I10 Essential (primary) hypertension: Secondary | ICD-10-CM

## 2022-09-16 DIAGNOSIS — Z1231 Encounter for screening mammogram for malignant neoplasm of breast: Secondary | ICD-10-CM

## 2022-09-16 DIAGNOSIS — E041 Nontoxic single thyroid nodule: Secondary | ICD-10-CM

## 2022-09-16 DIAGNOSIS — D369 Benign neoplasm, unspecified site: Secondary | ICD-10-CM

## 2022-09-16 DIAGNOSIS — Z5181 Encounter for therapeutic drug level monitoring: Secondary | ICD-10-CM

## 2022-09-16 DIAGNOSIS — G932 Benign intracranial hypertension: Secondary | ICD-10-CM

## 2022-09-16 DIAGNOSIS — I152 Hypertension secondary to endocrine disorders: Secondary | ICD-10-CM

## 2022-09-16 DIAGNOSIS — E785 Hyperlipidemia, unspecified: Secondary | ICD-10-CM

## 2022-09-16 DIAGNOSIS — Z6835 Body mass index (BMI) 35.0-35.9, adult: Secondary | ICD-10-CM

## 2022-09-16 LAB — POCT GLYCOSYLATED HEMOGLOBIN (HGB A1C): Hemoglobin A1C: 6.9 % — AB (ref 4.0–5.6)

## 2022-09-16 MED ORDER — AMLODIPINE BESYLATE 5 MG PO TABS: 5.0000 mg | ORAL_TABLET | Freq: Every day | ORAL | 1 refills | Status: DC

## 2022-09-16 MED ORDER — SEMAGLUTIDE (1 MG/DOSE) 4 MG/3ML ~~LOC~~ SOPN
1.0000 mg | PEN_INJECTOR | SUBCUTANEOUS | 0 refills | Status: DC
Start: 2022-09-16 — End: 2022-10-21
  Filled 2022-09-16: qty 3, 28d supply, fill #0

## 2022-09-16 MED ORDER — METFORMIN HCL ER 500 MG PO TB24: 500.0000 mg | ORAL_TABLET | Freq: Every day | ORAL | 1 refills | Status: DC

## 2022-09-30 ENCOUNTER — Other Ambulatory Visit: Payer: Self-pay | Admitting: Family Medicine

## 2022-09-30 DIAGNOSIS — E1169 Type 2 diabetes mellitus with other specified complication: Secondary | ICD-10-CM

## 2022-10-10 ENCOUNTER — Encounter: Payer: Self-pay | Admitting: *Deleted

## 2022-10-21 ENCOUNTER — Other Ambulatory Visit: Payer: Self-pay | Admitting: Family Medicine

## 2022-10-21 ENCOUNTER — Other Ambulatory Visit (HOSPITAL_BASED_OUTPATIENT_CLINIC_OR_DEPARTMENT_OTHER): Payer: Self-pay

## 2022-10-21 DIAGNOSIS — E1169 Type 2 diabetes mellitus with other specified complication: Secondary | ICD-10-CM

## 2022-10-21 MED ORDER — OZEMPIC (1 MG/DOSE) 4 MG/3ML ~~LOC~~ SOPN
1.0000 mg | PEN_INJECTOR | SUBCUTANEOUS | 1 refills | Status: DC
Start: 2022-10-21 — End: 2022-12-11
  Filled 2022-10-21: qty 3, 28d supply, fill #0
  Filled 2022-11-20: qty 3, 28d supply, fill #1

## 2022-11-29 ENCOUNTER — Other Ambulatory Visit: Payer: Self-pay | Admitting: Family Medicine

## 2022-11-29 DIAGNOSIS — I10 Essential (primary) hypertension: Secondary | ICD-10-CM

## 2022-12-10 NOTE — Progress Notes (Unsigned)
No chief complaint on file.   Crystal Mercado is a 57 y.o. female who presents for a complete physical and follow-up on chronic problems.    Diabetes--She reports compliance with Ozempic 1 mg daily and metformin once daily (cannot tolerate higher metformin doses d/t abdominal pain, nausea, no diarrhea). She is tolerating the ozempic well. Denies nausea, constipation. Last A1c was 6.9% in August 2024, on this regimen.  It had been up to 8.2% in 03/2022 when she had insurance issues, ended up being off of Trulicity for the month of January (had to switch to Trulicity 11/2021 when Ozempic wasn't covered; she changed insurance again this year).  Sugars    Denies polydipsia, polyuria, hypoglycemia. Last eye exam was in March 2024, at [facility] [place].  No diabetic retinopathy. She checks feet regularly, no numbness, tingling, lesions/concerns.   Hypertension:  She reports compliance in taking Amlodipine 5mg  an Valsartan 160mg . (HCTZ previously stopped due to gout).  BP's are running  She headaches, chest pain, palpitations, edema, dizziness, She tries to limit sodium intake in her diet. She has cut out the processed foods she used to eat (sausage, [place]).   BP Readings from Last 3 Encounters:  09/16/22 122/74  05/29/22 137/87  05/29/22 (!) 150/95      Hyperlipidemia: She reports compliance with rosuvastatin 10mg , and denies side effects. She tries to follow a low cholesterol diet.  Lipids were at goal on last check, due for recheck.   Lab Results  Component Value Date   CHOL 170 11/19/2021   HDL 68 11/19/2021   LDLCALC 77 11/19/2021   TRIG 148 11/19/2021   CHOLHDL 2.5 11/19/2021       Thyroid nodule was noted at her 03/2022 visit. She was sent for [place] which showed a nodule at the isthmus.  Annual [place] recommended until 5 years of stability.  She isn't aware of any changes to the size/appearance of her thyroid.   Pseudotumor cerebri. She had a flare in April 2024, seen in  ER. She reports getting a flare every 3-5 years. Treated with diamox for 30 days (all that was prescribed). She had no recurrent HA's after stopping. She was referred to neurologist. Apparently this hasn't been scheduled yet due to needing to make payments on bad debt  CT in ER showed small-vessel WM disease, small lacunar infarct at L caudate.  She hasn't had any further headaches, denies any vision changes, tinnitus, or other neurologic changes.     Gout--diagnosed with a flare in L great toe in 08/2020, treated with colchicine. This had occurred after a weekend with high alcohol intake. Since then, HCTZ was stopped. She had a gout flare in September 2023, in her toe, after celebrating friend's birthday (drinking a lot, shots, etc).  She has never taken allopurinol.  She prefers to avoid all alcohol rather than take more meds, declined rx for allopurinol. She hasn't had any further joint pain flares since 10/2021. She has only had very small amounts of alcohol since (<1 glass), and hasn't had any recurrent gout pain. Due for uric acid.    Immunization History  Administered Date(s) Administered   Influenza,inj,Quad PF,6+ Mos 10/05/2020, 11/23/2021   Influenza-Unspecified 12/28/2008, 02/05/2013, 12/01/2013, 10/17/2016, 01/20/2018, 04/12/2019   PFIZER(Purple Top)SARS-COV-2 Vaccination 04/03/2019, 04/30/2019, 01/15/2020   PPD Test 11/16/2015   Pfizer Covid-19 Vaccine Bivalent Booster 60yrs & up 11/24/2020   Pfizer(Comirnaty)Fall Seasonal Vaccine 12 years and older 12/07/2021   Pneumococcal Polysaccharide-23 02/05/2013   Tdap 07/29/2007, 01/15/2021  Zoster Recombinant(Shingrix) 02/02/2021, 04/13/2021   Last Pap smear: 05/2021 normal, no high risk HPV Last mammogram: 04/2019 Last colonoscopy: 07/2015 with Dr. Loreta Ave.  Adenomatous polyp.  5 yr f/u rec. Past due, referred to [facility] in August, hasn't scheduled yet Last DEXA: never Dentist: many years ago Ophtho: yearly, last 04/2022 Exercise:    None.  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    PMH, PSH, SH and FH were reviewed and updated     ROS: The patient denies fever, vision changes, decreased hearing, ear pain, sore throat, breast concerns, chest pain, palpitations, dizziness, syncope, dyspnea on exertion, cough, swelling, nausea, vomiting, diarrhea, constipation, abdominal pain, melena, hematochezia, indigestion/heartburn, hematuria, incontinence, dysuria, vaginal discharge, odor or itch, genital lesions, joint pains, numbness, tingling, weakness, tremor, suspicious skin lesions, depression, anxiety, abnormal bleeding/bruising, or enlarged lymph nodes.  No vaginal bleeding since her ablation. +hot flashes, mild  +snoring.  Unsure about apnea. She has insomnia, only sleeping 3 hours, so has fatigue.  Wakes up and can't get back to sleep.  Trigger finger L middle? HA's?    PHYSICAL EXAM:  There were no vitals taken for this visit.  Wt Readings from Last 3 Encounters:  09/16/22 199 lb 6.4 oz (90.4 kg)  05/29/22 209 lb 14.1 oz (95.2 kg)  03/18/22 209 lb 12.8 oz (95.2 kg)     General Appearance:    Alert, cooperative, no distress, appears stated age  Head:    Normocephalic, without obvious abnormality, atraumatic  Eyes:    PERRL, conjunctiva/corneas clear, EOM's intact, fundi benign  Ears:    Normal TM's and external ear canals  Nose:   Nares normal, no drainage or sinus tenderness  Throat:   Lips, mucosa, and tongue normal; teeth and gums normal  Neck:   Supple, no lymphadenopathy;  thyroid:  no enlargement/tenderness.  Nodule noted at isthmus, unchanged.  No carotid bruit or JVD  Back:    Spine nontender, no curvature, ROM normal, no CVA tenderness  Lungs:     Clear to auscultation bilaterally without wheezes, rales or ronchi; respirations unlabored  Chest Wall:    No tenderness or deformity   Heart:     Regular rate and rhythm, S1 and S2 normal, no murmur, rub or gallop  Breast Exam:    No tenderness, masses, or nipple discharge or inversion.  WHSS from reduction mammoplasty. There is a 0.5cmm mobile firm mass at 11 o'clock on R breast, just outside of areola, and a 1cm firm mass at 12- 1o'clock on L breast, ouside of areola. Pt states these have been present since her breast reduction (markers used), and denies any change.  More fibrocystic/fibroglandular changes were noted in the LUOQ, nontender. No axillary lymphadenopathy  Abdomen:     Soft, non-tender, nondistended, normoactive bowel sounds, no masses, no hepatosplenomegaly. WHSS RUQ.  Genitalia:    Normal external genitalia without lesions.  BUS and vagina normal; Uterus was tilted. No cervical motion tenderness. Uterus and adnexa not enlarged, though slightly limited by body habitus; nontender, no masses.  Pap not performed  Rectal:    Normal tone, no masses or tenderness; guaiac negative stool  Extremities:   No clubbing, cyanosis or edema  Pulses:   2+ and symmetric all extremities  Skin:   Skin color, texture, turgor normal, no rashes or lesions. There was scattered hyperpigmented lesions on upper back and arms (acne scars)  Lymph nodes:   Cervical, supraclavicular, inguinal and axillary nodes normal  Neurologic:   CNII-XII intact, normal strength, sensation and gait; reflexes 2+ and symmetric throughout  Psych:            Normal mood, affect, hygiene and grooming.     Lab Results  Component Value Date   HGBA1C 6.9 (A) 09/16/2022   ***UPDATE BREAST EXAM  ASSESSMENT/PLAN:  Flu, COVID.  Valsartan RF Crestor RF to wait until labs back.   A1c Lipids, uric acid, c-met, cbc, TSH, urine microalbumin  Her DM eye exam was abstracted wrong--has the date it was abstracted, not the date of the visit (which was in March, not August). Please fix  Repeat thyroid US due 03/2023.  Order  Past due for mammo, needs to schedule  Needs to  schedule with GI.  They sent MyChart message in September, pt needs to c/b and schedule.  She was referred to neuro for pseudotumor cerebri.  Per referral notes, she has bad debt, and hasn't paid anything on balance.  Guessing they won't schedule until she makes some sort of payment.   ***REFILL CRESTOR AFTER LABS BACK  Discussed monthly self breast exams and yearly mammograms (past due, reminded to schedule); at least 30 minutes of aerobic activity at least 5 days/week, weight-bearing exercise at least 2x/week; proper sunscreen use reviewed; healthy diet, including goals of calcium and vitamin D intake and alcohol recommendations (less than or equal to 1 drink/day) reviewed; regular seatbelt use; changing batteries in smoke detectors.  Immunization recommendations discussed--continue yearly flu shots COVID booster Consider pneurmovax 23 at next visit.  Colonoscopy recommendations reviewed, past due. Reminded to contact [facility] to schedule.  F/u 6 mos med check

## 2022-12-10 NOTE — Patient Instructions (Incomplete)
  HEALTH MAINTENANCE RECOMMENDATIONS:  It is recommended that you get at least 30 minutes of aerobic exercise at least 5 days/week (for weight loss, you may need as much as 60-90 minutes). This can be any activity that gets your heart rate up. This can be divided in 10-15 minute intervals if needed, but try and build up your endurance at least once a week.  Weight bearing exercise is also recommended twice weekly.  Eat a healthy diet with lots of vegetables, fruits and fiber.  "Colorful" foods have a lot of vitamins (ie green vegetables, tomatoes, red peppers, etc).  Limit sweet tea, regular sodas and alcoholic beverages, all of which has a lot of calories and sugar.  Up to 1 alcoholic drink daily may be beneficial for women (unless trying to lose weight, watch sugars).  Drink a lot of water.  Calcium recommendations are 1200-1500 mg daily (1500 mg for postmenopausal women or women without ovaries), and vitamin D 1000 IU daily.  This should be obtained from diet and/or supplements (vitamins), and calcium should not be taken all at once, but in divided doses.  Monthly self breast exams and yearly mammograms for women over the age of 59 is recommended.  Sunscreen of at least SPF 30 should be used on all sun-exposed parts of the skin when outside between the hours of 10 am and 4 pm (not just when at beach or pool, but even with exercise, golf, tennis, and yard work!)  Use a sunscreen that says "broad spectrum" so it covers both UVA and UVB rays, and make sure to reapply every 1-2 hours.  Remember to change the batteries in your smoke detectors when changing your clock times in the spring and fall. Carbon monoxide detectors are recommended for your home.  Use your seat belt every time you are in a car, and please drive safely and not be distracted with cell phones and texting while driving.  It is important that you contact [facility] and schedule your colonoscopy.  Their phone number is in the MyChart  message they sent you in September.  Please schedule your mammogram. Please follow-up with the neurologist, as we previously discussed.

## 2022-12-11 ENCOUNTER — Ambulatory Visit: Payer: 59 | Admitting: Family Medicine

## 2022-12-11 ENCOUNTER — Encounter: Payer: Self-pay | Admitting: Family Medicine

## 2022-12-11 ENCOUNTER — Other Ambulatory Visit (HOSPITAL_BASED_OUTPATIENT_CLINIC_OR_DEPARTMENT_OTHER): Payer: Self-pay

## 2022-12-11 VITALS — BP 132/84 | HR 72 | Ht 63.0 in | Wt 203.4 lb

## 2022-12-11 DIAGNOSIS — Z0000 Encounter for general adult medical examination without abnormal findings: Secondary | ICD-10-CM

## 2022-12-11 DIAGNOSIS — M25551 Pain in right hip: Secondary | ICD-10-CM

## 2022-12-11 DIAGNOSIS — Z1231 Encounter for screening mammogram for malignant neoplasm of breast: Secondary | ICD-10-CM

## 2022-12-11 DIAGNOSIS — E1159 Type 2 diabetes mellitus with other circulatory complications: Secondary | ICD-10-CM

## 2022-12-11 DIAGNOSIS — I10 Essential (primary) hypertension: Secondary | ICD-10-CM

## 2022-12-11 DIAGNOSIS — Z23 Encounter for immunization: Secondary | ICD-10-CM

## 2022-12-11 DIAGNOSIS — E1169 Type 2 diabetes mellitus with other specified complication: Secondary | ICD-10-CM

## 2022-12-11 DIAGNOSIS — G932 Benign intracranial hypertension: Secondary | ICD-10-CM | POA: Diagnosis not present

## 2022-12-11 DIAGNOSIS — Z1211 Encounter for screening for malignant neoplasm of colon: Secondary | ICD-10-CM

## 2022-12-11 DIAGNOSIS — D369 Benign neoplasm, unspecified site: Secondary | ICD-10-CM

## 2022-12-11 DIAGNOSIS — E041 Nontoxic single thyroid nodule: Secondary | ICD-10-CM | POA: Diagnosis not present

## 2022-12-11 DIAGNOSIS — Z5181 Encounter for therapeutic drug level monitoring: Secondary | ICD-10-CM

## 2022-12-11 DIAGNOSIS — K429 Umbilical hernia without obstruction or gangrene: Secondary | ICD-10-CM

## 2022-12-11 DIAGNOSIS — M109 Gout, unspecified: Secondary | ICD-10-CM

## 2022-12-11 MED ORDER — VALSARTAN 160 MG PO TABS: 160.0000 mg | ORAL_TABLET | Freq: Every day | ORAL | 1 refills | Status: DC

## 2022-12-11 MED ORDER — OZEMPIC (1 MG/DOSE) 4 MG/3ML ~~LOC~~ SOPN
1.0000 mg | PEN_INJECTOR | SUBCUTANEOUS | 5 refills | Status: DC
  Filled 2022-12-11 – 2022-12-12 (×2): qty 3, 28d supply, fill #0
  Filled 2023-01-25 (×2): qty 3, 28d supply, fill #1
  Filled 2023-02-23: qty 3, 28d supply, fill #2
  Filled 2023-03-28 – 2023-03-29 (×2): qty 3, 28d supply, fill #3
  Filled 2023-04-21 – 2023-05-03 (×2): qty 3, 28d supply, fill #4
  Filled 2023-06-07: qty 3, 28d supply, fill #5

## 2022-12-12 ENCOUNTER — Other Ambulatory Visit (HOSPITAL_BASED_OUTPATIENT_CLINIC_OR_DEPARTMENT_OTHER): Payer: Self-pay

## 2022-12-12 LAB — CBC WITH DIFFERENTIAL/PLATELET
Basophils Absolute: 0 10*3/uL (ref 0.0–0.2)
Basos: 0 %
EOS (ABSOLUTE): 0.2 10*3/uL (ref 0.0–0.4)
Eos: 3 %
Hematocrit: 41.3 % (ref 34.0–46.6)
Hemoglobin: 12.7 g/dL (ref 11.1–15.9)
Immature Grans (Abs): 0 10*3/uL (ref 0.0–0.1)
Immature Granulocytes: 0 %
Lymphocytes Absolute: 1.9 10*3/uL (ref 0.7–3.1)
Lymphs: 22 %
MCH: 25.5 pg — ABNORMAL LOW (ref 26.6–33.0)
MCHC: 30.8 g/dL — ABNORMAL LOW (ref 31.5–35.7)
MCV: 83 fL (ref 79–97)
Monocytes Absolute: 0.7 10*3/uL (ref 0.1–0.9)
Monocytes: 8 %
Neutrophils Absolute: 5.6 10*3/uL (ref 1.4–7.0)
Neutrophils: 67 %
Platelets: 251 10*3/uL (ref 150–450)
RBC: 4.98 x10E6/uL (ref 3.77–5.28)
RDW: 14.5 % (ref 11.7–15.4)
WBC: 8.5 10*3/uL (ref 3.4–10.8)

## 2022-12-12 LAB — CMP14+EGFR
ALT: 23 IU/L (ref 0–32)
AST: 25 [IU]/L (ref 0–40)
Albumin: 4.2 g/dL (ref 3.8–4.9)
Alkaline Phosphatase: 116 IU/L (ref 44–121)
BUN/Creatinine Ratio: 15 (ref 9–23)
BUN: 14 mg/dL (ref 6–24)
Bilirubin Total: 1.4 mg/dL — ABNORMAL HIGH (ref 0.0–1.2)
CO2: 23 mmol/L (ref 20–29)
Calcium: 9.8 mg/dL (ref 8.7–10.2)
Chloride: 103 mmol/L (ref 96–106)
Creatinine, Ser: 0.94 mg/dL (ref 0.57–1.00)
Globulin, Total: 3.2 g/dL (ref 1.5–4.5)
Glucose: 102 mg/dL — ABNORMAL HIGH (ref 70–99)
Potassium: 4.2 mmol/L (ref 3.5–5.2)
Sodium: 143 mmol/L (ref 134–144)
Total Protein: 7.4 g/dL (ref 6.0–8.5)
eGFR: 71 mL/min/{1.73_m2} (ref >59)

## 2022-12-12 LAB — TSH: TSH: 1.7 u[IU]/mL (ref 0.450–4.500)

## 2022-12-12 LAB — LIPID PANEL
Chol/HDL Ratio: 2.4 ratio (ref 0.0–4.4)
Cholesterol, Total: 171 mg/dL (ref 100–199)
HDL: 71 mg/dL (ref >39)
LDL Chol Calc (NIH): 78 mg/dL (ref 0–99)
Triglycerides: 124 mg/dL (ref 0–149)
VLDL Cholesterol Cal: 22 mg/dL (ref 5–40)

## 2022-12-12 LAB — URIC ACID: Uric Acid: 5.6 mg/dL (ref 3.0–7.2)

## 2022-12-12 LAB — MICROALBUMIN / CREATININE URINE RATIO
Creatinine, Urine: 295.1 mg/dL
Microalb/Creat Ratio: 14 mg/g{creat} (ref 0–29)
Microalbumin, Urine: 41.4 ug/mL

## 2022-12-12 MED ORDER — ROSUVASTATIN CALCIUM 10 MG PO TABS: 10.0000 mg | ORAL_TABLET | Freq: Every day | ORAL | 3 refills | Status: DC

## 2022-12-16 ENCOUNTER — Encounter (HOSPITAL_BASED_OUTPATIENT_CLINIC_OR_DEPARTMENT_OTHER): Payer: Self-pay | Admitting: Radiology

## 2022-12-16 ENCOUNTER — Ambulatory Visit (HOSPITAL_BASED_OUTPATIENT_CLINIC_OR_DEPARTMENT_OTHER)
Admission: RE | Admit: 2022-12-16 | Discharge: 2022-12-16 | Disposition: A | Discharge status: Home / Self Care | Payer: 59 | Source: Ambulatory Visit | Attending: Family Medicine | Admitting: Family Medicine

## 2022-12-16 DIAGNOSIS — Z1231 Encounter for screening mammogram for malignant neoplasm of breast: Secondary | ICD-10-CM | POA: Insufficient documentation

## 2022-12-19 ENCOUNTER — Ambulatory Visit (HOSPITAL_BASED_OUTPATIENT_CLINIC_OR_DEPARTMENT_OTHER): Payer: 59

## 2022-12-20 ENCOUNTER — Other Ambulatory Visit (INDEPENDENT_AMBULATORY_CARE_PROVIDER_SITE_OTHER): Payer: 59

## 2022-12-20 DIAGNOSIS — Z23 Encounter for immunization: Secondary | ICD-10-CM | POA: Diagnosis not present

## 2022-12-20 DIAGNOSIS — E1169 Type 2 diabetes mellitus with other specified complication: Secondary | ICD-10-CM

## 2022-12-20 LAB — HEMOGLOBIN A1C
Est. average glucose Bld gHb Est-mCnc: 148 mg/dL
Hgb A1c MFr Bld: 6.8 % — ABNORMAL HIGH (ref 4.8–5.6)

## 2023-01-10 ENCOUNTER — Other Ambulatory Visit (INDEPENDENT_AMBULATORY_CARE_PROVIDER_SITE_OTHER): Payer: 59

## 2023-01-10 DIAGNOSIS — Z23 Encounter for immunization: Secondary | ICD-10-CM

## 2023-01-25 ENCOUNTER — Other Ambulatory Visit (HOSPITAL_BASED_OUTPATIENT_CLINIC_OR_DEPARTMENT_OTHER): Payer: Self-pay

## 2023-02-28 ENCOUNTER — Telehealth: Payer: Self-pay

## 2023-02-28 ENCOUNTER — Other Ambulatory Visit (HOSPITAL_COMMUNITY): Payer: Self-pay

## 2023-02-28 NOTE — Telephone Encounter (Signed)
Pharmacy Patient Advocate Encounter   Received notification from CoverMyMeds that prior authorization for Crystal Mercado is required/requested.   Insurance verification completed.   The patient is insured through [facility] .   Per test claim: PA required; PA submitted to above mentioned insurance via CoverMyMeds Key/confirmation #/EOC Key: [id number]    Status is pending

## 2023-03-03 ENCOUNTER — Other Ambulatory Visit (HOSPITAL_COMMUNITY): Payer: Self-pay

## 2023-03-06 ENCOUNTER — Other Ambulatory Visit (HOSPITAL_COMMUNITY): Payer: Self-pay

## 2023-03-06 NOTE — Telephone Encounter (Signed)
Pharmacy Patient Advocate Encounter  Received notification from [facility] that Prior Authorization for Ozempic has been APPROVED from 1.29.25 to 1.29.26. Ran test claim, Copay is $rts and is fillable on/after 03/17/23. This test claim was processed through [facility]- copay amounts may vary at other pharmacies due to pharmacy/plan contracts, or as the patient moves through the different stages of their insurance plan.

## 2023-03-19 ENCOUNTER — Ambulatory Visit (HOSPITAL_BASED_OUTPATIENT_CLINIC_OR_DEPARTMENT_OTHER): Payer: 59

## 2023-03-20 ENCOUNTER — Ambulatory Visit (HOSPITAL_BASED_OUTPATIENT_CLINIC_OR_DEPARTMENT_OTHER)
Admission: RE | Admit: 2023-03-20 | Discharge: 2023-03-20 | Disposition: A | Discharge status: Home / Self Care | Payer: 59 | Source: Ambulatory Visit | Attending: Family Medicine | Admitting: Family Medicine

## 2023-03-20 DIAGNOSIS — E041 Nontoxic single thyroid nodule: Secondary | ICD-10-CM | POA: Insufficient documentation

## 2023-03-25 ENCOUNTER — Other Ambulatory Visit: Payer: Self-pay | Admitting: Family Medicine

## 2023-03-25 DIAGNOSIS — I10 Essential (primary) hypertension: Secondary | ICD-10-CM

## 2023-03-29 ENCOUNTER — Other Ambulatory Visit (HOSPITAL_BASED_OUTPATIENT_CLINIC_OR_DEPARTMENT_OTHER): Payer: Self-pay

## 2023-03-31 ENCOUNTER — Encounter: Payer: Self-pay | Admitting: Family Medicine

## 2023-05-01 ENCOUNTER — Other Ambulatory Visit (HOSPITAL_BASED_OUTPATIENT_CLINIC_OR_DEPARTMENT_OTHER): Payer: Self-pay

## 2023-05-03 ENCOUNTER — Other Ambulatory Visit (HOSPITAL_BASED_OUTPATIENT_CLINIC_OR_DEPARTMENT_OTHER): Payer: Self-pay

## 2023-06-09 ENCOUNTER — Encounter: Payer: Self-pay | Admitting: *Deleted

## 2023-06-11 ENCOUNTER — Encounter: Payer: 59 | Admitting: Family Medicine

## 2023-06-14 ENCOUNTER — Other Ambulatory Visit (HOSPITAL_COMMUNITY): Payer: Self-pay

## 2023-06-22 ENCOUNTER — Other Ambulatory Visit: Payer: Self-pay | Admitting: Family Medicine

## 2023-06-22 DIAGNOSIS — I10 Essential (primary) hypertension: Secondary | ICD-10-CM

## 2023-06-23 ENCOUNTER — Other Ambulatory Visit: Payer: Self-pay | Admitting: Family Medicine

## 2023-06-23 DIAGNOSIS — I10 Essential (primary) hypertension: Secondary | ICD-10-CM

## 2023-06-23 DIAGNOSIS — E1169 Type 2 diabetes mellitus with other specified complication: Secondary | ICD-10-CM

## 2023-07-06 NOTE — Progress Notes (Unsigned)
 No chief complaint on file.  Patient presents for follow-up on chronic problems.  Last seen at [facility] in 12/2022.  Diabetes-- Last A1c was 6.8% in 12/2022 on Ozempic  1mg  and metformin  once daily (she cannot tolerate higher metformin  doses d/t abdominal pain and nausea, no diarrhea).  ***??COMPLIANT?  She is tolerating the ozempic  well. Denies constipation. She gets occasional nausea, maybe 2x/week.  Tends to occur just the first couple of days after the injection.  Usually in the mornings, on an empty stomach, not after eating.   (A1c had been up to 8.2% in 03/2022 when she had insurance issues, ended up being off of Trulicity  for the month of January (had to switch to Trulicity  11/2021 when Ozempic  wasn't covered).   Sugars are running ***   Denies polydipsia, polyuria, hypoglycemia. Last eye exam was in March 2024, at [facility] at [place].  No diabetic retinopathy. She checks feet regularly, no numbness, tingling, lesions/concerns.    Hypertension:  She reports compliance in taking Amlodipine  5mg  an Valsartan  160mg . (HCTZ previously stopped due to gout).   BP's are running ***   She chest pain, palpitations, edema, dizziness.    She gets some headaches between her eyes. She tries to limit sodium intake in her diet.  BP Readings from Last 3 Encounters:  12/11/22 132/84  09/16/22 122/74  05/29/22 137/87      Hyperlipidemia: She reports compliance with rosuvastatin  10mg , and denies side effects. She tries to follow a low cholesterol diet.  Lipids were at goal on last check. +cheese.  +eggs on the weekends (4/weekend).  Lab Results  Component Value Date   CHOL 171 12/11/2022   HDL 71 12/11/2022   LDLCALC 78 12/11/2022   TRIG 124 12/11/2022   CHOLHDL 2.4 12/11/2022      Thyroid  nodule was noted at her 03/2022 visit. She was sent for US  which showed a nodule at the isthmus.  Annual US  was recommended until 5 years of stability.  Repeat US  03/2023: IMPRESSION: 1. Surgical  changes of prior left hemithyroidectomy. 2. The isthmic nodule under imaging surveillance has undergone interval cystic degeneration and is now mixed cystic/solid in appearance which is a sonographically low risk/benign morphology. No further follow-up is recommended. 3. No new nodules or suspicious features.  She isn't aware of any changes to the size/appearance of her thyroid . She had L lobe of thyroid  removed in 2003 for "goiter"--benign.  Umbilical hernia was noted and discussed at her 12/2022 physical. She denies any changes, denies pain.   Right hip/groin pain--discussed at physical 12/2022, had some pain with external rotation.  Suspected OA. She declined imaging.  Weight loss was encouraged. Today she reports ***  H/o tubular adenoma. Last colonoscopy: 07/2015 with Dr. Tova Fresh.  She had adenomatous polyp and 5 yr f/u was recommended.  She was referred to [facility] in August, hasn't scheduled yet.  Pseudotumor cerebri. She had a flare in April 2024, seen in ER. She reports getting a flare every 3-5 years. Treated with diamox  for 30 days (all that was prescribed). She had no recurrent HA's after stopping.  She was referred to neurologist but never scheduled appointment (has bad debt).   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 She hasn't had any further similar headaches, denies any vision changes, tinnitus, or other neurologic changes.     Gout--diagnosed with a flare in L great toe in 08/2020, treated with colchicine . This had occurred after a weekend with high alcohol intake.  Since then, HCTZ was stopped. She had a gout flare in September 2023, in her toe, after celebrating friend's birthday (drinking a lot, shots, etc).  She has never taken allopurinol.  She prefers to avoid all alcohol rather than take more meds. She hasn't had any further joint pain flares since 10/2021. She avoids all alcohol and hasn't had any recurrent gout pain.  Last uric  acid level was normal: Lab Results  Component Value Date   LABURIC 5.6 12/11/2022      PMH, PSH, SH reviewed   ROS: no fever, chills, URI symptoms or allergies. No n/v/d, heartburn, edema, chest pain, palpitations, shortness of breath. Denies change in bowels.  She sometimes sees blood after bowel movements, when constipated. Has known hemorrhoids.  No other bleeding, bruising, abdominal pain. Moods are good.  No headaches, dizziness, vision changes.  R hip pain Umbilical hernia No change to thyroid  L middle finger trigger finger--less often, but still occurs (s/p 1 injection) Ganglion cyst at R wrist is nontender.    PHYSICAL EXAM:  There were no vitals taken for this visit.  Wt Readings from Last 3 Encounters:  12/11/22 203 lb 6.4 oz (92.3 kg)  09/16/22 199 lb 6.4 oz (90.4 kg)  05/29/22 209 lb 14.1 oz (95.2 kg)    Well-appearing female, in no distress HEENT: PERRL, EOMI, conjunctiva and sclera are clear. OP clear, sinuses nontender Neck: no lymphadenopathy, or mass. Noted to have enlarged thyroid , slightly nodular, on the right. Heart: regular rate and rhythm Lungs: clear bilaterally Abdomen: soft, nontender, no organomegaly or mass.  ***umbilical hernia? Back: no CVA tenderness Psych: normal mood, affect, hygiene and grooming Neuro: alert and oriented, cranial nerves grossly intact. Normal gait Extremities: no edema, normal pulses. Soft tissue mass c/w ganglion noted at radial aspect of R wrist, nontender.  Hip***   ***UPDATE thyroid --nodule on R? Umbilical hernia? Painful ext rotation R hip?   ASSESSMENT/PLAN:   A1c Prevnar-20  ??compliance with ozempic  (written x 6 mos in 12/2022) and metformin  (written #90 x 1 rf in 09/2022). Also may have run out of valsartan ? Last filled 12/2022  Eye exam since 04/2022? If so, get, if not, remind to schedule. Did she ever call GI to schedule colonoscopy? Past due ([facility] referral was done, prev saw Dr. Tova Fresh,  who no longer took her insurance)   INAPPROPRIATE THAT ADAM REFILLED AMLODIPINE  FOR 6 MOS IN MAY WHEN PAST DUE FOR VISIT!  F/u as scheduled for [facility] in 12/2023

## 2023-07-06 NOTE — Patient Instructions (Incomplete)
 You are past due for colonoscopy for pre-cancerous polyps. Please call [facility] to schedule your appointment. [phone] option 1   Please schedule your diabetic eye exam and have them send us  a copy of the report.  We are giving you a 2 week sample of the [facility] continuous glucose monitor (CGM).  This will give you a try to see how to use it and see what your sugars are running at various times of the day. I have a feeling that your insurance won't cover this OR the Dexcom (they often require you to be on insulin).  If either one of these ARE covered, let us  know and we can send in a prescription.  Using fluticasone (flonase) DAILY might help more for your sinus headaches than the other prescription spray you had been taking.   It can take up to 1-2 weeks to see the full effect. Using an antihistamine like zyrtec, claritin or allegra works faster, and can help while waiting for the flonase to become effective.  Stop using the [employer] nasal spray.   Try taking over-the-counter Pepcid twice daily OR prilosec OTC (omeprazole 20 mg) once daily.  Do this for 2 weeks and let us  know if your chest burning symptoms do not resolved. Continue to try and eat small portions (frequently, if needed) rather than large meals. Wait at least 2-3 hours after eating before laying down. Cut back on citrus.

## 2023-07-07 ENCOUNTER — Encounter: Payer: Self-pay | Admitting: Family Medicine

## 2023-07-07 ENCOUNTER — Other Ambulatory Visit (HOSPITAL_BASED_OUTPATIENT_CLINIC_OR_DEPARTMENT_OTHER): Payer: Self-pay

## 2023-07-07 ENCOUNTER — Ambulatory Visit: Admitting: Family Medicine

## 2023-07-07 VITALS — BP 136/88 | HR 88 | Ht 63.0 in | Wt 198.0 lb

## 2023-07-07 DIAGNOSIS — K219 Gastro-esophageal reflux disease without esophagitis: Secondary | ICD-10-CM | POA: Diagnosis not present

## 2023-07-07 DIAGNOSIS — D369 Benign neoplasm, unspecified site: Secondary | ICD-10-CM

## 2023-07-07 DIAGNOSIS — E1159 Type 2 diabetes mellitus with other circulatory complications: Secondary | ICD-10-CM

## 2023-07-07 DIAGNOSIS — K429 Umbilical hernia without obstruction or gangrene: Secondary | ICD-10-CM

## 2023-07-07 DIAGNOSIS — E785 Hyperlipidemia, unspecified: Secondary | ICD-10-CM

## 2023-07-07 DIAGNOSIS — E1169 Type 2 diabetes mellitus with other specified complication: Secondary | ICD-10-CM

## 2023-07-07 DIAGNOSIS — I152 Hypertension secondary to endocrine disorders: Secondary | ICD-10-CM

## 2023-07-07 DIAGNOSIS — E041 Nontoxic single thyroid nodule: Secondary | ICD-10-CM

## 2023-07-07 DIAGNOSIS — M25551 Pain in right hip: Secondary | ICD-10-CM

## 2023-07-07 DIAGNOSIS — Z23 Encounter for immunization: Secondary | ICD-10-CM | POA: Diagnosis not present

## 2023-07-07 DIAGNOSIS — M109 Gout, unspecified: Secondary | ICD-10-CM

## 2023-07-07 DIAGNOSIS — J309 Allergic rhinitis, unspecified: Secondary | ICD-10-CM | POA: Diagnosis not present

## 2023-07-07 LAB — POCT GLYCOSYLATED HEMOGLOBIN (HGB A1C): Hemoglobin A1C: 6.3 % — AB (ref 4.0–5.6)

## 2023-07-07 MED ORDER — OZEMPIC (1 MG/DOSE) 4 MG/3ML ~~LOC~~ SOPN
1.0000 mg | PEN_INJECTOR | SUBCUTANEOUS | 5 refills | Status: DC
  Filled 2023-07-07: qty 3, 28d supply, fill #0
  Filled 2023-08-09 (×2): qty 3, 28d supply, fill #1
  Filled 2023-09-13 (×2): qty 3, 28d supply, fill #2
  Filled 2023-10-11: qty 3, 28d supply, fill #3
  Filled 2023-11-08: qty 3, 28d supply, fill #4
  Filled 2023-12-13 (×2): qty 3, 28d supply, fill #5

## 2023-07-07 MED ORDER — METFORMIN HCL ER 500 MG PO TB24
500.0000 mg | ORAL_TABLET | Freq: Every day | ORAL | 1 refills | Status: DC
Start: 2023-07-07 — End: 2023-12-16

## 2023-07-07 MED ORDER — FLUTICASONE PROPIONATE 50 MCG/ACT NA SUSP: 2.0000 | Freq: Every day | NASAL | 6 refills | Status: DC

## 2023-07-07 MED ORDER — VALSARTAN 160 MG PO TABS
160.0000 mg | ORAL_TABLET | Freq: Every day | ORAL | 1 refills | Status: DC
Start: 2023-07-07 — End: 2023-12-16

## 2023-07-21 ENCOUNTER — Telehealth: Payer: Self-pay | Admitting: Internal Medicine

## 2023-07-21 NOTE — Telephone Encounter (Signed)
 Ok to switch to G7  Copied from [employer] [id number]. Topic: Clinical - Order For Equipment >> Jul 21, 2023  3:18 PM Crystal Mercado wrote: Reason for CRM: Patient wants a return about the Free Libre 3 monitor in her arm. Insurance will pay Dexcom G6 or G7. They just need a PA to approve it. Can you give a sample of Dexcom? Current monitor ends in 22 hours. Callback number is [phone]

## 2023-07-22 MED ORDER — DEXCOM G7 SENSOR MISC: 1 refills | Status: DC

## 2023-07-22 MED ORDER — DEXCOM G7 RECEIVER DEVI: 0 refills | Status: DC

## 2023-07-22 NOTE — Telephone Encounter (Signed)
 Sent in Dexcom G7 to pharmacy.

## 2023-08-09 ENCOUNTER — Other Ambulatory Visit (HOSPITAL_BASED_OUTPATIENT_CLINIC_OR_DEPARTMENT_OTHER): Payer: Self-pay

## 2023-08-09 ENCOUNTER — Other Ambulatory Visit (HOSPITAL_COMMUNITY): Payer: Self-pay

## 2023-08-22 ENCOUNTER — Encounter: Payer: Self-pay | Admitting: Advanced Practice Midwife

## 2023-09-13 ENCOUNTER — Other Ambulatory Visit (HOSPITAL_BASED_OUTPATIENT_CLINIC_OR_DEPARTMENT_OTHER): Payer: Self-pay

## 2023-10-13 ENCOUNTER — Other Ambulatory Visit (HOSPITAL_BASED_OUTPATIENT_CLINIC_OR_DEPARTMENT_OTHER): Payer: Self-pay

## 2023-10-25 ENCOUNTER — Other Ambulatory Visit (HOSPITAL_BASED_OUTPATIENT_CLINIC_OR_DEPARTMENT_OTHER): Payer: Self-pay

## 2023-12-13 ENCOUNTER — Other Ambulatory Visit (HOSPITAL_BASED_OUTPATIENT_CLINIC_OR_DEPARTMENT_OTHER): Payer: Self-pay

## 2023-12-16 NOTE — Progress Notes (Unsigned)
 No chief complaint on file.  Crystal Mercado is a 58 y.o. female who presents for a complete physical and follow-up on chronic problems.    She was last seen in June. At that time she was complaining of symptoms which we felt could be related to GERD. She had belching, burning in her chest. She had been eating more pineapple and oranges at work.  Possibly could have been related to the Ozempic  as well.  We discussed reflux precautions (diet, behaviors), and Pepcid AC vs Prilosec OTC x 2 weeks. Today she reports ***    Diabetes--Has been well controlled on Ozempic  1mg  and metformin  once daily (cannot tolerate higher metformin  doses d/t abd pain, nausea, no diarrhea). Last A1c was 6.3% in 07/2023.   She is tolerating the ozempic  well. Denies constipation.   Sugars are running ***  Denies polydipsia, polyuria, hypoglycemia. Last eye exam was in March 2024, at [facility] at [place].  No diabetic retinopathy. She was reminded in June to schedule her 2025 visit.  *** She checks feet regularly, no numbness, tingling, lesions/concerns.   Hypertension:  She reports compliance in taking Amlodipine  5mg  an Valsartan  160mg . (HCTZ previously stopped due to gout).   BP's are running ***  Has Chinese food 2x/month (payday). She is otherwise careful about sodium intake. She denies chest pain, palpitations, edema, dizziness.    She gets some headaches between her eyes, which she feels are related to sinuses. ***  BP Readings from Last 3 Encounters:  07/07/23 136/88  12/11/22 132/84  09/16/22 122/74    Hyperlipidemia: She reports compliance with rosuvastatin  10mg , and denies side effects. She tries to follow a low cholesterol diet.  Lipids were at goal on last check, due for recheck today. Prior to check in 12/2022 she was eating 4 eggs/week (weekends).  She no longer eats eggs (too expensive!). She has some cheese. Eats mostly chicken. No longer eats pork.  Has occasional beef (a few  times/month).  Lab Results  Component Value Date   CHOL 171 12/11/2022   HDL 71 12/11/2022   LDLCALC 78 12/11/2022   TRIG 124 12/11/2022   CHOLHDL 2.4 12/11/2022       Thyroid  nodule was noted at her 03/2022 visit. She was sent for US  which showed a nodule at the isthmus.  Annual US  was recommended until 5 years of stability.  Repeat US  03/2023: IMPRESSION: 1. Surgical changes of prior left hemithyroidectomy. 2. The isthmic nodule under imaging surveillance has undergone interval cystic degeneration and is now mixed cystic/solid in appearance which is a sonographically low risk/benign morphology. No further follow-up is recommended. 3. No new nodules or suspicious features.   She isn't aware of any changes to the size/appearance of her thyroid . She had L lobe of thyroid  removed in 2003 for goiter--benign.   Umbilical hernia--this was noted and discussed at her 12/2022 physical. She denies any changes, denies pain.   Right hip/groin pain--discussed at physical 12/2022, had some pain with external rotation.  Suspected OA. She declined imaging.  Weight loss was encouraged.  Now only has R hip pain when it rains.   H/o tubular adenoma. Last colonoscopy: 07/2015 with Dr. Kristie.  She had adenomatous polyp and 5 yr f/u was recommended.  She was referred to [facility] in August 2024. She was given the phone number to call [facility] to schedule at her June visit. She hasn't scheduled this yet.   H/o Pseudotumor cerebri. She had a flare in April 2024, seen in ER.  She reports getting a flare every 3-5 years. Treated with diamox  for 30 days (all that was prescribed). She had no recurrent HA's after stopping. Never followed up with neurologist (financial reasons). 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 She hasn't had any further similar headaches, denies any vision changes, tinnitus, or other neurologic changes.     Gout--diagnosed with a flare in L great toe  in 08/2020, treated with colchicine . This had occurred after a weekend with high alcohol intake. Since then, HCTZ was stopped. She had a gout flare in September 2023, in her toe, after celebrating friend's birthday (drinking a lot, shots, etc).  She has never taken allopurinol.  She prefers to avoid all alcohol rather than take more meds. She hasn't had any further joint pain flares since 10/2021. She avoids all alcohol and hasn't had any recurrent gout pain.  Last uric acid level was normal:  Lab Results  Component Value Date   LABURIC 5.6 12/11/2022    Snoring:  She is often tired, only gets 5-6 hours/night, has some daytime somnolence during the week only. She feel refreshed when she gets enough sleep (weekends). Unsure about apnea. ***UPDATE   Immunization History  Administered Date(s) Administered   Influenza, Seasonal, Injecte, Preservative Fre 12/20/2022   Influenza,inj,Quad PF,6+ Mos 10/05/2020, 11/23/2021   Influenza-Unspecified 12/28/2008, 02/05/2013, 12/01/2013, 10/17/2016, 01/20/2018, 04/12/2019   PFIZER(Purple Top)SARS-COV-2 Vaccination 04/03/2019, 04/30/2019, 01/15/2020   PNEUMOCOCCAL CONJUGATE-20 07/07/2023   PPD Test 11/16/2015   Pfizer Covid-19 Vaccine Bivalent Booster 74yrs & up 11/24/2020   Pfizer(Comirnaty)Fall Seasonal Vaccine 12 years and older 12/07/2021, 01/10/2023   Pneumococcal Polysaccharide-23 02/05/2013   Tdap 07/29/2007, 01/15/2021   Zoster Recombinant(Shingrix) 02/02/2021, 04/13/2021   Last Pap smear: 05/2021 normal, no high risk HPV Last mammogram: 12/2022 Last colonoscopy: 07/2015 with Dr. Kristie.  Adenomatous polyp.  5 yr f/u rec. Past due, referred to [facility], hasn't scheduled yet Last DEXA: never Dentist: many years ago *** Ophtho: yearly, last 04/2022 *** Exercise:  She uses exercise cycle machine (at home and work) daily while seated at her desk.   PMH, PSH, SH and FH were reviewed and updated    ROS: The patient denies fever, vision  changes, decreased hearing, ear pain, sore throat, breast concerns, chest pain, palpitations, dizziness, syncope, dyspnea on exertion, cough, swelling, nausea, vomiting, diarrhea, constipation, abdominal pain, melena, hematochezia, indigestion/heartburn, hematuria, incontinence, dysuria, vaginal discharge, odor or itch, genital lesions, joint pains, numbness, tingling, weakness, tremor, suspicious skin lesions, depression, anxiety, abnormal bleeding/bruising, or enlarged lymph nodes.  No vaginal bleeding since her ablation. +hot flashes, mild/infrequent. Sinus headaches Hip pain when rains GERD No changes to neck/thyroid     PHYSICAL EXAM:  There were no vitals taken for this visit.  Wt Readings from Last 3 Encounters:  07/07/23 198 lb (89.8 kg)  12/11/22 203 lb 6.4 oz (92.3 kg)  09/16/22 199 lb 6.4 oz (90.4 kg)     General Appearance:    Alert, cooperative, no distress, appears stated age  Head:    Normocephalic, without obvious abnormality, atraumatic  Eyes:    PERRL, conjunctiva/corneas clear, EOM's intact, fundi benign  Ears:    Normal TM's and external ear canals  Nose:   No drainage or sinus tenderness  Throat:   Lips, mucosa, and tongue normal; teeth and gums normal  Neck:   Supple, no lymphadenopathy;  thyroid :  no enlargement/tenderness.  Nodule noted at R thyroid , unchanged.  No carotid bruit or JVD  Back:  Spine nontender, no curvature, ROM normal, no CVA tenderness  Lungs:     Clear to auscultation bilaterally without wheezes, rales or ronchi; respirations unlabored  Chest Wall:    No tenderness or deformity   Heart:    Regular rate and rhythm, S1 and S2 normal, no murmur, rub or gallop  Breast Exam:    No tenderness, masses, or nipple discharge or inversion.  WHSS from reduction mammoplasty. There is a 0.20mm mobile firm mass at 11 o'clock on R breast, just outside of areola, and a 1-1.5cm firm mass at 12- 1o'clock on L breast, ouside of areola. Pt states these have been  present since her breast reduction (markers used), and denies any change.  Mild fibrocystic/fibroglandular changes were noted in the LUOQ, nontender. No axillary lymphadenopathy ***  Abdomen:     Soft, non-tender, nondistended, normoactive bowel sounds, no masses, no hepatosplenomegaly. WHSS RUQ.  Umbilical hernia noted, inferiorly.  Easily reducible, nontender  Genitalia:    Normal external genitalia without lesions.  BUS and vagina normal; Uterus was tilted. No cervical motion tenderness. Uterus and adnexa not enlarged, though slightly limited by body habitus; nontender, no masses.  Pap not performed  Rectal:    Normal tone, no masses or tenderness; guaiac negative stool  Extremities:   No clubbing, cyanosis or edema  Pulses:   2+ and symmetric all extremities  Skin:   Skin color, texture, turgor normal, no rashes or lesions. There was scattered hyperpigmented lesions on upper back and arms (acne scars)  Lymph nodes:   Cervical, supraclavicular, inguinal and axillary nodes normal  Neurologic:   CNII-XII intact, normal strength, sensation and gait; reflexes 2+ and symmetric throughout  Psych:            Normal mood, affect, hygiene and grooming.     ***UPDATE THYROID  NODULE, breast exam, umbilical hernia inferiorly UPDATE skin DIABETIC FOOT EXAM  Normal diabetic foot exam--normal pulses, no lesions, intact sensation to monofilament.  Lab Results  Component Value Date   HGBA1C 6.3 (A) 07/07/2023     ASSESSMENT/PLAN:  Diabetic foot exam  A1c C-met, cbc, lipids, TSH, urine microalb/Cr, uric acid  Did she get/schedule eye exam (was reminded at June visit) Did she schedule colonoscopy? (Also reminded at June visit)--she was given # last visit, give again, if needed (on her AVS and in MyChart from their message) Is mammo scheduled? Due now  Order US  to f/u thyroid  nodule, due 03/2024  Flu, COVID  ***REFILL CRESTOR  X 1 YEAR AFTER LABS BACK   Discussed monthly self breast exams and  yearly mammograms (due, reminded to schedule); at least 30 minutes of aerobic activity at least 5 days/week, weight-bearing exercise at least 2x/week; proper sunscreen use reviewed; healthy diet, including goals of calcium  and vitamin D intake and alcohol recommendations (less than or equal to 1 drink/day) reviewed; regular seatbelt use; changing batteries in smoke detectors.  Immunization recommendations discussed--continue yearly flu shots.*** COVID booster *** Colonoscopy recommendations reviewed, past due. Reminded to contact [facility] to schedule.  F/u 6 mos med check, CPE 1 year

## 2023-12-16 NOTE — Patient Instructions (Incomplete)
 HEALTH MAINTENANCE RECOMMENDATIONS:  It is recommended that you get at least 30 minutes of aerobic exercise at least 5 days/week (for weight loss, you may need as much as 60-90 minutes). This can be any activity that gets your heart rate up. This can be divided in 10-15 minute intervals if needed, but try and build up your endurance at least once a week.  Weight bearing exercise is also recommended twice weekly.  Eat a healthy diet with lots of vegetables, fruits and fiber.  Colorful foods have a lot of vitamins (ie green vegetables, tomatoes, red peppers, etc).  Limit sweet tea, regular sodas and alcoholic beverages, all of which has a lot of calories and sugar.  Up to 1 alcoholic drink daily may be beneficial for women (unless trying to lose weight, watch sugars).  Drink a lot of water.  Calcium  recommendations are 1200-1500 mg daily (1500 mg for postmenopausal women or women without ovaries), and vitamin D 1000 IU daily.  This should be obtained from diet and/or supplements (vitamins), and calcium  should not be taken all at once, but in divided doses.  Monthly self breast exams and yearly mammograms for women over the age of 71 is recommended.  Sunscreen of at least SPF 30 should be used on all sun-exposed parts of the skin when outside between the hours of 10 am and 4 pm (not just when at beach or pool, but even with exercise, golf, tennis, and yard work!)  Use a sunscreen that says broad spectrum so it covers both UVA and UVB rays, and make sure to reapply every 1-2 hours.  Remember to change the batteries in your smoke detectors when changing your clock times in the spring and fall. Carbon monoxide detectors are recommended for your home.  Use your seat belt every time you are in a car, and please drive safely and not be distracted with cell phones and texting while driving.  It is important to get regular eye exams--related to diabetes, as well as pseudotumor cerebri (they can pick up  the elevated intracranial pressure on eye exams). Please schedule your eye exam.  Please contact [facility] to schedule your colonoscopy.  You are past due, as we discussed, and previously had pre-cancerous polyps.  Please schedule your mammogram, I recommend these yearly. We have ordered a follow-up thyroid  ultrasound, which is due to be done in February 2026. They should contact you to schedule this (or you can schedule it when you call to schedule your mammogram).  Please work on getting more vegetables and non-acidic fruits in your diet. Fiber is very important. Try and make sure that you are getting adequate protein in your diet, but not from processed meats/foods. Consider trying protein shakes when you are pressed for time Roslynn has 30 grams protein, 2 grams of sugar). Pairing that with carrots, apples, or other fruit/vegetables would be a better quick meal between jobs.  Please cut out sugary drinks from your diet, and french fries (don't get the combo meals from fast food). Ideally try and get more grilled chicken in place of fried chicken and in place of burgers. I also recommend black bean or veggie burgers (you can buy these and are a quick meal). Please go back to being more conscious of the quality and nutritional value of the food you are eating, like you previously did. This will help with weight loss, and overall improved health (sugars, triglycerides, cholesterol, etc).  Please ask your family about your breathing when you  sleep (in bed)--do you have pauses in your breathing.  I encourage you to get routine dental cleanings  Your blood pressure was elevated today, and you reported values at home higher than we would like to see. Please cut back on the sodium in your diet ,as we discussed. Check your blood pressures regularly. Return in 1 month and bring your list of blood pressures with you to that visit.

## 2023-12-17 ENCOUNTER — Ambulatory Visit: Payer: 59 | Admitting: Family Medicine

## 2023-12-17 ENCOUNTER — Other Ambulatory Visit (HOSPITAL_BASED_OUTPATIENT_CLINIC_OR_DEPARTMENT_OTHER): Payer: Self-pay

## 2023-12-17 VITALS — BP 130/82 | HR 80 | Ht 63.0 in | Wt 198.8 lb

## 2023-12-17 DIAGNOSIS — Z7985 Long-term (current) use of injectable non-insulin antidiabetic drugs: Secondary | ICD-10-CM

## 2023-12-17 DIAGNOSIS — E1169 Type 2 diabetes mellitus with other specified complication: Secondary | ICD-10-CM | POA: Diagnosis not present

## 2023-12-17 DIAGNOSIS — M109 Gout, unspecified: Secondary | ICD-10-CM | POA: Diagnosis not present

## 2023-12-17 DIAGNOSIS — E041 Nontoxic single thyroid nodule: Secondary | ICD-10-CM | POA: Diagnosis not present

## 2023-12-17 DIAGNOSIS — Z6835 Body mass index (BMI) 35.0-35.9, adult: Secondary | ICD-10-CM

## 2023-12-17 DIAGNOSIS — I152 Hypertension secondary to endocrine disorders: Secondary | ICD-10-CM

## 2023-12-17 DIAGNOSIS — E785 Hyperlipidemia, unspecified: Secondary | ICD-10-CM

## 2023-12-17 DIAGNOSIS — Z0000 Encounter for general adult medical examination without abnormal findings: Secondary | ICD-10-CM | POA: Diagnosis not present

## 2023-12-17 DIAGNOSIS — Z1211 Encounter for screening for malignant neoplasm of colon: Secondary | ICD-10-CM

## 2023-12-17 DIAGNOSIS — Z23 Encounter for immunization: Secondary | ICD-10-CM

## 2023-12-17 DIAGNOSIS — E1159 Type 2 diabetes mellitus with other circulatory complications: Secondary | ICD-10-CM

## 2023-12-17 DIAGNOSIS — Z5181 Encounter for therapeutic drug level monitoring: Secondary | ICD-10-CM

## 2023-12-17 DIAGNOSIS — E66812 Obesity, class 2: Secondary | ICD-10-CM

## 2023-12-17 DIAGNOSIS — K429 Umbilical hernia without obstruction or gangrene: Secondary | ICD-10-CM

## 2023-12-17 LAB — POCT GLYCOSYLATED HEMOGLOBIN (HGB A1C): Hemoglobin A1C: 6.4 % — AB (ref 4.0–5.6)

## 2023-12-17 MED ORDER — METFORMIN HCL ER 500 MG PO TB24: 500.0000 mg | ORAL_TABLET | Freq: Every day | ORAL | 1 refills | Status: AC

## 2023-12-17 MED ORDER — DEXCOM G7 RECEIVER DEVI: 0 refills | Status: AC

## 2023-12-17 MED ORDER — DEXCOM G7 SENSOR MISC: 1 refills | Status: AC

## 2023-12-17 MED ORDER — OZEMPIC (1 MG/DOSE) 4 MG/3ML ~~LOC~~ SOPN
1.0000 mg | PEN_INJECTOR | SUBCUTANEOUS | 5 refills | Status: AC
  Filled 2023-12-17 – 2024-01-30 (×2): qty 3, 28d supply, fill #0
  Filled 2024-02-26: qty 3, 28d supply, fill #1

## 2023-12-17 MED ORDER — VALSARTAN 160 MG PO TABS: 160.0000 mg | ORAL_TABLET | Freq: Every day | ORAL | 1 refills | Status: AC

## 2023-12-17 MED ORDER — AMLODIPINE BESYLATE 5 MG PO TABS: 5.0000 mg | ORAL_TABLET | Freq: Every day | ORAL | 1 refills | Status: AC

## 2023-12-17 MED ORDER — COLCHICINE 0.6 MG PO TABS
ORAL_TABLET | ORAL | 0 refills | Status: AC
Start: 2023-12-17 — End: ?

## 2023-12-18 ENCOUNTER — Ambulatory Visit: Payer: Self-pay | Admitting: Family Medicine

## 2023-12-18 LAB — LIPID PANEL
Chol/HDL Ratio: 2.2 ratio (ref 0.0–4.4)
Cholesterol, Total: 152 mg/dL (ref 100–199)
HDL: 68 mg/dL (ref >39)
LDL Chol Calc (NIH): 61 mg/dL (ref 0–99)
Triglycerides: 134 mg/dL (ref 0–149)
VLDL Cholesterol Cal: 23 mg/dL (ref 5–40)

## 2023-12-18 LAB — CBC WITH DIFFERENTIAL/PLATELET
Basophils Absolute: 0 x10E3/uL (ref 0.0–0.2)
Basos: 0 %
EOS (ABSOLUTE): 0.2 x10E3/uL (ref 0.0–0.4)
Eos: 2 %
Hematocrit: 41.7 % (ref 34.0–46.6)
Hemoglobin: 12.7 g/dL (ref 11.1–15.9)
Immature Grans (Abs): 0 x10E3/uL (ref 0.0–0.1)
Immature Granulocytes: 0 %
Lymphocytes Absolute: 1.8 x10E3/uL (ref 0.7–3.1)
Lymphs: 18 %
MCH: 25.6 pg — ABNORMAL LOW (ref 26.6–33.0)
MCHC: 30.5 g/dL — ABNORMAL LOW (ref 31.5–35.7)
MCV: 84 fL (ref 79–97)
Monocytes Absolute: 0.6 x10E3/uL (ref 0.1–0.9)
Monocytes: 6 %
Neutrophils Absolute: 7.4 x10E3/uL — ABNORMAL HIGH (ref 1.4–7.0)
Neutrophils: 74 %
Platelets: 257 x10E3/uL (ref 150–450)
RBC: 4.97 x10E6/uL (ref 3.77–5.28)
RDW: 14.6 % (ref 11.7–15.4)
WBC: 10 x10E3/uL (ref 3.4–10.8)

## 2023-12-18 LAB — COMPREHENSIVE METABOLIC PANEL WITH GFR
ALT: 25 IU/L (ref 0–32)
AST: 32 IU/L (ref 0–40)
Albumin: 4.3 g/dL (ref 3.8–4.9)
Alkaline Phosphatase: 130 IU/L (ref 49–135)
BUN/Creatinine Ratio: 18 (ref 9–23)
BUN: 16 mg/dL (ref 6–24)
Bilirubin Total: 1.1 mg/dL (ref 0.0–1.2)
CO2: 25 mmol/L (ref 20–29)
Calcium: 9.4 mg/dL (ref 8.7–10.2)
Chloride: 103 mmol/L (ref 96–106)
Creatinine, Ser: 0.88 mg/dL (ref 0.57–1.00)
Globulin, Total: 3 g/dL (ref 1.5–4.5)
Glucose: 95 mg/dL (ref 70–99)
Potassium: 3.7 mmol/L (ref 3.5–5.2)
Sodium: 143 mmol/L (ref 134–144)
Total Protein: 7.3 g/dL (ref 6.0–8.5)
eGFR: 77 mL/min/1.73 (ref >59)

## 2023-12-18 LAB — MICROALBUMIN / CREATININE URINE RATIO
Creatinine, Urine: 176.4 mg/dL
Microalb/Creat Ratio: 20 mg/g{creat} (ref 0–29)
Microalbumin, Urine: 34.6 ug/mL

## 2023-12-18 LAB — URIC ACID: Uric Acid: 5 mg/dL (ref 3.0–7.2)

## 2023-12-18 LAB — TSH: TSH: 1.94 u[IU]/mL (ref 0.450–4.500)

## 2023-12-18 MED ORDER — ROSUVASTATIN CALCIUM 10 MG PO TABS: 10.0000 mg | ORAL_TABLET | Freq: Every day | ORAL | 3 refills | Status: AC

## 2023-12-19 ENCOUNTER — Other Ambulatory Visit (INDEPENDENT_AMBULATORY_CARE_PROVIDER_SITE_OTHER)

## 2023-12-19 DIAGNOSIS — Z23 Encounter for immunization: Secondary | ICD-10-CM

## 2023-12-19 NOTE — Progress Notes (Signed)
 Patient is in office today for a nurse visit for Immunization. Patient Injection was given in the  Left deltoid. Patient tolerated injection well.

## 2024-01-08 ENCOUNTER — Encounter: Payer: Self-pay | Admitting: *Deleted

## 2024-01-09 ENCOUNTER — Other Ambulatory Visit

## 2024-01-09 DIAGNOSIS — Z23 Encounter for immunization: Secondary | ICD-10-CM | POA: Diagnosis not present

## 2024-01-10 ENCOUNTER — Other Ambulatory Visit: Payer: Self-pay | Admitting: Family Medicine

## 2024-01-10 DIAGNOSIS — M109 Gout, unspecified: Secondary | ICD-10-CM

## 2024-01-11 NOTE — Progress Notes (Unsigned)
 No chief complaint on file.  Hypertension:  She reports compliance in taking Amlodipine  5mg  an Valsartan  160mg . (HCTZ previously stopped due to gout). At her physical last month, BP's at home were above goal, higher when eating processed or salty foods. BP was 150/90 at her visit. Today she reports *** Cutting back on sodium BP's at home have been running ***  She denies chest pain, palpitations, edema, dizziness.   Hasn't had any sinus headaches recently.   BP Readings from Last 3 Encounters:  12/17/23 130/82  07/07/23 136/88  12/11/22 132/84     Diabetes--Has been well controlled on Ozempic  1mg  and metformin  once daily (cannot tolerate higher metformin  doses d/t abd pain, nausea, no diarrhea).   She is tolerating the ozempic  well. Denies constipation or other side effects. Last A1c was 6.4% in 12/2023.    She was surprised that her A1c wasn't worse, as she admitted that her diet was better when she had been using CGM, holding her more accountable. We sent in another Rx *** Sugars are   PMH, PSH, SH reviewed   ROS:    PHYSICAL EXAM:  There were no vitals taken for this visit.  Wt Readings from Last 3 Encounters:  12/17/23 198 lb 12.8 oz (90.2 kg)  07/07/23 198 lb (89.8 kg)  12/11/22 203 lb 6.4 oz (92.3 kg)   Well-appearing female, in no distress HEENT: PERRL, EOMI, conjunctiva and sclera are clear. Neck: no lymphadenopathy, or mass. Noted to have enlarged thyroid , slightly nodular, on the right. Heart: regular rate and rhythm Lungs: clear bilaterally Abdomen: soft, nontender, no organomegaly or mass.  Back: no CVA tenderness Psych: normal mood, affect, hygiene and grooming Neuro: alert and oriented, cranial nerves grossly intact. Normal gait Extremities: no edema, normal pulses.     ASSESSMENT/PLAN:  She should have list of BPs with her Did she get  CGM??

## 2024-01-12 ENCOUNTER — Encounter: Payer: Self-pay | Admitting: Family Medicine

## 2024-01-12 ENCOUNTER — Telehealth: Payer: Self-pay | Admitting: *Deleted

## 2024-01-12 ENCOUNTER — Ambulatory Visit: Admitting: Family Medicine

## 2024-01-12 ENCOUNTER — Telehealth: Payer: Self-pay | Admitting: Gastroenterology

## 2024-01-12 VITALS — BP 130/86 | HR 84 | Ht 63.0 in | Wt 200.6 lb

## 2024-01-12 DIAGNOSIS — I152 Hypertension secondary to endocrine disorders: Secondary | ICD-10-CM

## 2024-01-12 DIAGNOSIS — D369 Benign neoplasm, unspecified site: Secondary | ICD-10-CM

## 2024-01-12 DIAGNOSIS — M79641 Pain in right hand: Secondary | ICD-10-CM

## 2024-01-12 DIAGNOSIS — E1159 Type 2 diabetes mellitus with other circulatory complications: Secondary | ICD-10-CM | POA: Diagnosis not present

## 2024-01-12 DIAGNOSIS — E1169 Type 2 diabetes mellitus with other specified complication: Secondary | ICD-10-CM | POA: Diagnosis not present

## 2024-01-12 NOTE — Telephone Encounter (Signed)
 Patient has been unable to get get Dexcom G7 receiver and sensor, she said it needs PA. Can someone please assist? Thanks.

## 2024-01-12 NOTE — Patient Instructions (Signed)
 Continue your current blood pressure medications. Your home blood pressures have been very good. You can see how sodium affects your blood pressure, with your BP being higher today at home (and in the office) after having sausage. Continue to limit sodium in your diet. Try and exercise daily and work on weight loss. Continue to periodically check your blood sugar. We have sent a message to the prior auth team regarding the DexCom.

## 2024-01-12 NOTE — Telephone Encounter (Signed)
 Good afternoon Dr. Stacia,   DOD PM 12/8   Patient is wishing to be scheduled for a colonoscopy. Patient last had a colonoscopy in 2017 with [facility]. Patient is requesting to transfer due to [facility] no longer accepting her insurance. Patient's previous records are in Epic under procedure tab for you to review and advise on scheduling.   Thank you.

## 2024-01-13 ENCOUNTER — Other Ambulatory Visit (HOSPITAL_COMMUNITY): Payer: Self-pay

## 2024-01-13 ENCOUNTER — Telehealth: Payer: Self-pay

## 2024-01-13 NOTE — Telephone Encounter (Addendum)
 Pharmacy Patient Advocate Encounter   Received notification from PT [employer]S [employer] that prior authorization for Dexcom G7 Sensor  is required/requested.   Insurance verification completed.   The patient is insured through [employer].   Per test claim: PA required; PA submitted to above mentioned insurance via Latent Key/confirmation #/EOC [id number] Status is pending

## 2024-01-16 LAB — OPHTHALMOLOGY REPORT-SCANNED

## 2024-01-16 NOTE — Telephone Encounter (Signed)
° °  Office has been made aware.

## 2024-01-20 NOTE — Telephone Encounter (Signed)
 Patient advised.

## 2024-01-22 NOTE — Telephone Encounter (Signed)
 Left patient voicemail to call and discuss scheduling

## 2024-01-30 ENCOUNTER — Other Ambulatory Visit (HOSPITAL_BASED_OUTPATIENT_CLINIC_OR_DEPARTMENT_OTHER): Payer: Self-pay

## 2024-02-13 ENCOUNTER — Other Ambulatory Visit: Payer: Self-pay | Admitting: Family Medicine

## 2024-02-13 DIAGNOSIS — J309 Allergic rhinitis, unspecified: Secondary | ICD-10-CM

## 2024-02-22 NOTE — Progress Notes (Unsigned)
 "     Ellouise Console, PA-C [address] [place], [place]  [address] Phone: [phone]   Gastroenterology Consultation  Referring Provider:     Randol Dawes, MD Primary Care Physician:  Randol Dawes, MD Primary Gastroenterologist:  Ellouise Console, PA-C / Glendia Holt, MD  Reason for Consultation:     Rectal bleeding, hemorrhoids, history of colon polyp        HPI:   Discussed the use of AI scribe software for clinical note transcription with the patient, who gave verbal consent to proceed.  New patient.  Here to evaluate rectal bleeding and hemorrhoids.  07/2015 last colonoscopy by Dr. Kristie: 6 small hyperplastic polyps removed from sigmoid colon.  One 8 mm tubular adenoma polyp removed from ascending colon.  Prominent internal hemorrhoids.  Prep was adequate.  History of Present Illness      Past Medical History:  Diagnosis Date   Anemia    Diabetes mellitus without complication (HCC)    Family history of breast cancer    Family history of ovarian cancer    Goiter    Headache    Hypertension    Pseudotumor cerebri 02/2019   Shortness of breath    with exercise - no meds- r/t weight per pt.   Vision abnormalities     Past Surgical History:  Procedure Laterality Date   bilateral macromastia  09/2005   reduction   BREAST REDUCTION SURGERY     CESAREAN SECTION     x 3   CHOLECYSTECTOMY     REDUCTION MAMMAPLASTY Bilateral 2007   THYROID  LOBECTOMY  03/2001    left side   TUBAL LIGATION      Prior to Admission medications  Medication Sig Start Date End Date Taking? Authorizing Provider  amLODipine  (NORVASC ) 5 MG tablet Take 1 tablet (5 mg total) by mouth daily. 12/17/23   Randol Dawes, MD  ASPIRIN 81 PO Take 1 tablet by mouth daily.    [provider]  colchicine  0.6 MG tablet At the start of a gout flare, take 2 tablets. After an hour, take 1 more tablet. Do no repeat dosing for 48 hours. 12/17/23   Randol Dawes, MD  Continuous Glucose Receiver (DEXCOM G7  RECEIVER) DEVI Use with G7 sensor Patient not taking: Reported on 01/12/2024 12/17/23   Randol Dawes, MD  Continuous Glucose Sensor (DEXCOM G7 SENSOR) MISC Use sensor for 10 days then switch to a new onee Patient not taking: Reported on 01/12/2024 12/17/23   Randol Dawes, MD  fluticasone  (FLONASE ) 50 MCG/ACT nasal spray SPRAY 2 SPRAYS INTO EACH NOSTRIL EVERY DAY 02/13/24   Randol Dawes, MD  ipratropium (ATROVENT ) 0.06 % nasal spray Place 2 sprays into both nostrils 4 (four) times daily. 06/24/16   Pennie Elsie PARAS, FNP  loratadine (CLARITIN) 10 MG tablet Take 10 mg by mouth daily.    [provider]  metFORMIN  (GLUCOPHAGE -XR) 500 MG 24 hr tablet Take 1 tablet (500 mg total) by mouth daily with breakfast. 12/17/23   Randol Dawes, MD  ondansetron  (ZOFRAN  ODT) 4 MG disintegrating tablet Take 1 tablet (4 mg total) by mouth every 8 (eight) hours as needed for nausea or vomiting. 04/24/20   Corlis Burnard DEL, NP  rosuvastatin  (CRESTOR ) 10 MG tablet Take 1 tablet (10 mg total) by mouth daily. 12/18/23   Randol Dawes, MD  Semaglutide , 1 MG/DOSE, (OZEMPIC , 1 MG/DOSE,) 4 MG/3ML SOPN Inject 1 mg into the skin once a week. 12/17/23   Randol Dawes, MD  valsartan  (DIOVAN ) 160 MG tablet Take 1 tablet (160 mg total) by mouth daily. 12/17/23   Randol Dawes, MD    Family History  Problem Relation Age of Onset   Breast cancer Mother 34   Hypertension Father    CAD Father        died from massive MI in 1's   Breast cancer Sister 71   Diabetes Sister    Hypertension Sister    Diabetes Sister        prediabetes   Hypertension Sister    Thyroid  disease Sister    Hypercholesterolemia Sister    Hypertension Brother    Hypertension Brother    Hypertension Brother    Stroke Brother 60   Stomach cancer Maternal Grandmother        unsure if this is the actual cancer   Ovarian cancer Maternal Aunt    CAD Maternal Aunt        died in her sleep at 25   Ovarian cancer Cousin        maternal first cousin dx in her 84s    Stroke Cousin        late 56's; 1st cousin   Prostate cancer Cousin    Pulmonary embolism Cousin    Hypertension Daughter      Social History[1]  Allergies as of 02/27/2024 - Review Complete 01/12/2024  Allergen Reaction Noted   Shrimp [shellfish allergy] Anaphylaxis 01/09/2011    Review of Systems:    All systems reviewed and negative except where noted in HPI.   Physical Exam:  There were no vitals taken for this visit. No LMP recorded. Patient has had an ablation.  General:   Alert,  Well-developed, well-nourished, pleasant and cooperative in NAD Lungs:  Respirations even and unlabored.  Clear throughout to auscultation.   No wheezes, crackles, or rhonchi. No acute distress. Heart:  Regular rate and rhythm; no murmurs, clicks, rubs, or gallops. Abdomen:  Normal bowel sounds.  No bruits.  Soft, and non-distended without masses, hepatosplenomegaly or hernias noted.  No Tenderness.  No guarding or rebound tenderness.    Neurologic:  Alert and oriented x3;  grossly normal neurologically. Psych:  Alert and cooperative. Normal mood and affect.   Imaging Studies: No results found.  Labs: CBC    Component Value Date/Time   WBC 10.0 12/17/2023 1115   WBC 10.2 05/29/2022 1552   RBC 4.97 12/17/2023 1115   RBC 5.17 (H) 05/29/2022 1552   HGB 12.7 12/17/2023 1115   HCT 41.7 12/17/2023 1115   PLT 257 12/17/2023 1115   MCV 84 12/17/2023 1115    CMP     Component Value Date/Time   NA 143 12/17/2023 1115   K 3.7 12/17/2023 1115   CL 103 12/17/2023 1115   CO2 25 12/17/2023 1115   GLUCOSE 95 12/17/2023 1115   GLUCOSE 90 05/29/2022 1552   BUN 16 12/17/2023 1115   CREATININE 0.88 12/17/2023 1115   CALCIUM  9.4 12/17/2023 1115   PROT 7.3 12/17/2023 1115   ALBUMIN 4.3 12/17/2023 1115   AST 32 12/17/2023 1115   ALT 25 12/17/2023 1115   ALKPHOS 130 12/17/2023 1115   BILITOT 1.1 12/17/2023 1115   GFRNONAA >60 05/29/2022 1552   GFRAA 82 (L) 01/11/2013 1947    Assessment and  Plan:   Crystal Mercado is a 59 y.o. y/o female has been referred for ***  Assessment and Plan Assessment & Plan       Follow up ***  Ellouise Console, PA-C       [1]  Social History Tobacco Use   Smoking status: Never   Smokeless tobacco: Never  Vaping Use   Vaping status: Never Used  Substance Use Topics   Alcohol use: Not Currently    Comment: very little   Drug use: No   "

## 2024-02-27 ENCOUNTER — Ambulatory Visit: Admitting: Physician Assistant

## 2024-02-27 ENCOUNTER — Other Ambulatory Visit (HOSPITAL_BASED_OUTPATIENT_CLINIC_OR_DEPARTMENT_OTHER): Payer: Self-pay

## 2024-03-08 ENCOUNTER — Other Ambulatory Visit (HOSPITAL_COMMUNITY): Payer: Self-pay

## 2024-03-11 ENCOUNTER — Other Ambulatory Visit (HOSPITAL_COMMUNITY): Payer: Self-pay

## 2024-03-25 ENCOUNTER — Ambulatory Visit: Admitting: Physician Assistant

## 2024-06-16 ENCOUNTER — Ambulatory Visit: Admitting: Family Medicine

## 2025-01-06 ENCOUNTER — Encounter: Admitting: Family Medicine
